# Patient Record
Sex: Female | Born: 1940
Health system: Southern US, Community
[De-identification: ages and names within clinical notes are randomized; demographics above are authoritative.]

## PROBLEM LIST (undated history)

## (undated) DIAGNOSIS — E7800 Pure hypercholesterolemia, unspecified: Secondary | ICD-10-CM

## (undated) DIAGNOSIS — F419 Anxiety disorder, unspecified: Secondary | ICD-10-CM

## (undated) DIAGNOSIS — R011 Cardiac murmur, unspecified: Secondary | ICD-10-CM

## (undated) DIAGNOSIS — I10 Essential (primary) hypertension: Secondary | ICD-10-CM

## (undated) DIAGNOSIS — C801 Malignant (primary) neoplasm, unspecified: Secondary | ICD-10-CM

## (undated) DIAGNOSIS — C50919 Malignant neoplasm of unspecified site of unspecified female breast: Secondary | ICD-10-CM

## (undated) HISTORY — DX: Malignant neoplasm of unspecified site of unspecified female breast: C50.919

## (undated) HISTORY — DX: Essential (primary) hypertension: I10

## (undated) HISTORY — DX: Pure hypercholesterolemia, unspecified: E78.00

## (undated) HISTORY — DX: Malignant (primary) neoplasm, unspecified: C80.1

## (undated) HISTORY — PX: CHOLECYSTECTOMY: SHX55

## (undated) HISTORY — DX: Anxiety disorder, unspecified: F41.9

---

## 1988-12-09 HISTORY — PX: CHOLECYSTECTOMY, LAPAROSCOPIC: SHX56

## 1998-09-09 ENCOUNTER — Emergency Department (HOSPITAL_COMMUNITY): Admission: EM | Admit: 1998-09-09 | Discharge: 1998-09-09 | Payer: Self-pay | Admitting: Emergency Medicine

## 1998-09-09 ENCOUNTER — Encounter: Payer: Self-pay | Admitting: Emergency Medicine

## 1999-08-27 ENCOUNTER — Other Ambulatory Visit: Admission: RE | Admit: 1999-08-27 | Discharge: 1999-08-27 | Payer: Self-pay | Admitting: Orthopedic Surgery

## 2001-12-09 HISTORY — PX: CATARACT EXTRACTION, BILATERAL: SHX1313

## 2009-12-28 ENCOUNTER — Encounter: Admission: RE | Admit: 2009-12-28 | Discharge: 2009-12-28 | Payer: Self-pay | Admitting: General Surgery

## 2010-01-01 ENCOUNTER — Ambulatory Visit (HOSPITAL_BASED_OUTPATIENT_CLINIC_OR_DEPARTMENT_OTHER): Admission: RE | Admit: 2010-01-01 | Discharge: 2010-01-01 | Payer: Self-pay | Admitting: General Surgery

## 2010-01-19 ENCOUNTER — Ambulatory Visit: Payer: Self-pay | Admitting: Oncology

## 2010-01-30 ENCOUNTER — Ambulatory Visit: Admission: RE | Admit: 2010-01-30 | Discharge: 2010-04-11 | Payer: Self-pay | Admitting: Radiation Oncology

## 2010-02-05 LAB — CBC WITH DIFFERENTIAL/PLATELET
BASO%: 0.3 % (ref 0.0–2.0)
Basophils Absolute: 0 10*3/uL (ref 0.0–0.1)
EOS%: 1.1 % (ref 0.0–7.0)
Eosinophils Absolute: 0.1 10*3/uL (ref 0.0–0.5)
HCT: 38.3 % (ref 34.8–46.6)
HGB: 13.7 g/dL (ref 11.6–15.9)
LYMPH%: 21.9 % (ref 14.0–49.7)
MCH: 33 pg (ref 25.1–34.0)
MCHC: 35.7 g/dL (ref 31.5–36.0)
MCV: 92.4 fL (ref 79.5–101.0)
MONO#: 0.7 10*3/uL (ref 0.1–0.9)
MONO%: 7.1 % (ref 0.0–14.0)
NEUT#: 7 10*3/uL — ABNORMAL HIGH (ref 1.5–6.5)
NEUT%: 69.6 % (ref 38.4–76.8)
Platelets: 288 10*3/uL (ref 145–400)
RBC: 4.15 10*6/uL (ref 3.70–5.45)
RDW: 12.3 % (ref 11.2–14.5)
WBC: 10.1 10*3/uL (ref 3.9–10.3)
lymph#: 2.2 10*3/uL (ref 0.9–3.3)

## 2010-02-05 LAB — COMPREHENSIVE METABOLIC PANEL
ALT: 16 U/L (ref 0–35)
AST: 21 U/L (ref 0–37)
Albumin: 4.1 g/dL (ref 3.5–5.2)
Alkaline Phosphatase: 63 U/L (ref 39–117)
BUN: 9 mg/dL (ref 6–23)
CO2: 26 mEq/L (ref 19–32)
Calcium: 9.4 mg/dL (ref 8.4–10.5)
Chloride: 102 mEq/L (ref 96–112)
Creatinine, Ser: 1.15 mg/dL (ref 0.40–1.20)
Glucose, Bld: 89 mg/dL (ref 70–99)
Potassium: 3.7 mEq/L (ref 3.5–5.3)
Sodium: 138 mEq/L (ref 135–145)
Total Bilirubin: 0.9 mg/dL (ref 0.3–1.2)
Total Protein: 8 g/dL (ref 6.0–8.3)

## 2010-02-28 ENCOUNTER — Ambulatory Visit: Payer: Self-pay | Admitting: Oncology

## 2010-03-02 LAB — CBC WITH DIFFERENTIAL/PLATELET
BASO%: 0.4 % (ref 0.0–2.0)
Basophils Absolute: 0 10*3/uL (ref 0.0–0.1)
EOS%: 1.7 % (ref 0.0–7.0)
Eosinophils Absolute: 0.2 10*3/uL (ref 0.0–0.5)
HCT: 39 % (ref 34.8–46.6)
HGB: 13.8 g/dL (ref 11.6–15.9)
LYMPH%: 17.9 % (ref 14.0–49.7)
MCH: 33 pg (ref 25.1–34.0)
MCHC: 35.3 g/dL (ref 31.5–36.0)
MCV: 93.7 fL (ref 79.5–101.0)
MONO#: 0.7 10*3/uL (ref 0.1–0.9)
MONO%: 6.6 % (ref 0.0–14.0)
NEUT#: 7.5 10*3/uL — ABNORMAL HIGH (ref 1.5–6.5)
NEUT%: 73.4 % (ref 38.4–76.8)
Platelets: 308 10*3/uL (ref 145–400)
RBC: 4.16 10*6/uL (ref 3.70–5.45)
RDW: 12.5 % (ref 11.2–14.5)
WBC: 10.3 10*3/uL (ref 3.9–10.3)
lymph#: 1.8 10*3/uL (ref 0.9–3.3)

## 2010-03-02 LAB — BASIC METABOLIC PANEL
BUN: 15 mg/dL (ref 6–23)
CO2: 25 mEq/L (ref 19–32)
Calcium: 9.7 mg/dL (ref 8.4–10.5)
Chloride: 101 mEq/L (ref 96–112)
Creatinine, Ser: 1 mg/dL (ref 0.40–1.20)
Glucose, Bld: 98 mg/dL (ref 70–99)
Potassium: 4 mEq/L (ref 3.5–5.3)
Sodium: 137 mEq/L (ref 135–145)

## 2010-04-27 ENCOUNTER — Ambulatory Visit: Payer: Self-pay | Admitting: Oncology

## 2010-04-30 LAB — CBC WITH DIFFERENTIAL/PLATELET
BASO%: 0 % (ref 0.0–2.0)
Basophils Absolute: 0 10*3/uL (ref 0.0–0.1)
EOS%: 2.5 % (ref 0.0–7.0)
Eosinophils Absolute: 0.2 10*3/uL (ref 0.0–0.5)
HCT: 39.1 % (ref 34.8–46.6)
HGB: 13.7 g/dL (ref 11.6–15.9)
LYMPH%: 10.6 % — ABNORMAL LOW (ref 14.0–49.7)
MCH: 32 pg (ref 25.1–34.0)
MCHC: 35 g/dL (ref 31.5–36.0)
MCV: 91.4 fL (ref 79.5–101.0)
MONO#: 0.6 10*3/uL (ref 0.1–0.9)
MONO%: 5.7 % (ref 0.0–14.0)
NEUT#: 7.9 10*3/uL — ABNORMAL HIGH (ref 1.5–6.5)
NEUT%: 81.2 % — ABNORMAL HIGH (ref 38.4–76.8)
Platelets: 308 10*3/uL (ref 145–400)
RBC: 4.28 10*6/uL (ref 3.70–5.45)
RDW: 12.8 % (ref 11.2–14.5)
WBC: 9.8 10*3/uL (ref 3.9–10.3)
lymph#: 1 10*3/uL (ref 0.9–3.3)

## 2010-04-30 LAB — BASIC METABOLIC PANEL
BUN: 16 mg/dL (ref 6–23)
CO2: 21 mEq/L (ref 19–32)
Calcium: 9.6 mg/dL (ref 8.4–10.5)
Chloride: 102 mEq/L (ref 96–112)
Creatinine, Ser: 0.9 mg/dL (ref 0.40–1.20)
Glucose, Bld: 106 mg/dL — ABNORMAL HIGH (ref 70–99)
Potassium: 4.1 mEq/L (ref 3.5–5.3)
Sodium: 136 mEq/L (ref 135–145)

## 2010-06-21 ENCOUNTER — Ambulatory Visit: Payer: Self-pay | Admitting: Oncology

## 2010-06-25 LAB — CBC WITH DIFFERENTIAL/PLATELET
BASO%: 0.9 % (ref 0.0–2.0)
Basophils Absolute: 0.1 10*3/uL (ref 0.0–0.1)
EOS%: 3.3 % (ref 0.0–7.0)
Eosinophils Absolute: 0.3 10*3/uL (ref 0.0–0.5)
HCT: 39.2 % (ref 34.8–46.6)
HGB: 13.8 g/dL (ref 11.6–15.9)
LYMPH%: 14 % (ref 14.0–49.7)
MCH: 32.7 pg (ref 25.1–34.0)
MCHC: 35.2 g/dL (ref 31.5–36.0)
MCV: 92.9 fL (ref 79.5–101.0)
MONO#: 0.5 10*3/uL (ref 0.1–0.9)
MONO%: 5.8 % (ref 0.0–14.0)
NEUT#: 6.2 10*3/uL (ref 1.5–6.5)
NEUT%: 76 % (ref 38.4–76.8)
Platelets: 302 10*3/uL (ref 145–400)
RBC: 4.23 10*6/uL (ref 3.70–5.45)
RDW: 13.3 % (ref 11.2–14.5)
WBC: 8.2 10*3/uL (ref 3.9–10.3)
lymph#: 1.1 10*3/uL (ref 0.9–3.3)

## 2010-06-26 LAB — COMPREHENSIVE METABOLIC PANEL
ALT: 11 U/L (ref 0–35)
AST: 14 U/L (ref 0–37)
Albumin: 4.6 g/dL (ref 3.5–5.2)
Alkaline Phosphatase: 60 U/L (ref 39–117)
BUN: 13 mg/dL (ref 6–23)
CO2: 24 mEq/L (ref 19–32)
Calcium: 9.6 mg/dL (ref 8.4–10.5)
Chloride: 106 mEq/L (ref 96–112)
Creatinine, Ser: 1 mg/dL (ref 0.40–1.20)
Glucose, Bld: 92 mg/dL (ref 70–99)
Potassium: 4.3 mEq/L (ref 3.5–5.3)
Sodium: 141 mEq/L (ref 135–145)
Total Bilirubin: 0.5 mg/dL (ref 0.3–1.2)
Total Protein: 7.8 g/dL (ref 6.0–8.3)

## 2010-12-21 ENCOUNTER — Emergency Department (HOSPITAL_COMMUNITY)
Admission: EM | Admit: 2010-12-21 | Discharge: 2010-12-21 | Payer: Self-pay | Source: Home / Self Care | Admitting: Emergency Medicine

## 2010-12-24 LAB — CBC
HCT: 40.1 % (ref 36.0–46.0)
Hemoglobin: 14.2 g/dL (ref 12.0–15.0)
MCH: 32.6 pg (ref 26.0–34.0)
MCHC: 35.4 g/dL (ref 30.0–36.0)
MCV: 92 fL (ref 78.0–100.0)
Platelets: 260 10*3/uL (ref 150–400)
RBC: 4.36 MIL/uL (ref 3.87–5.11)
RDW: 12.4 % (ref 11.5–15.5)
WBC: 10.1 10*3/uL (ref 4.0–10.5)

## 2010-12-24 LAB — URINALYSIS, ROUTINE W REFLEX MICROSCOPIC
Bilirubin Urine: NEGATIVE
Hgb urine dipstick: NEGATIVE
Ketones, ur: NEGATIVE mg/dL
Nitrite: NEGATIVE
Protein, ur: NEGATIVE mg/dL
Specific Gravity, Urine: 1.006 (ref 1.005–1.030)
Urine Glucose, Fasting: NEGATIVE mg/dL
Urobilinogen, UA: 0.2 mg/dL (ref 0.0–1.0)
pH: 6.5 (ref 5.0–8.0)

## 2010-12-24 LAB — DIFFERENTIAL
Basophils Absolute: 0 10*3/uL (ref 0.0–0.1)
Basophils Relative: 0 % (ref 0–1)
Eosinophils Absolute: 0.2 10*3/uL (ref 0.0–0.7)
Eosinophils Relative: 2 % (ref 0–5)
Lymphocytes Relative: 17 % (ref 12–46)
Lymphs Abs: 1.7 10*3/uL (ref 0.7–4.0)
Monocytes Absolute: 0.6 10*3/uL (ref 0.1–1.0)
Monocytes Relative: 6 % (ref 3–12)
Neutro Abs: 7.5 10*3/uL (ref 1.7–7.7)
Neutrophils Relative %: 74 % (ref 43–77)

## 2010-12-24 LAB — BASIC METABOLIC PANEL
BUN: 13 mg/dL (ref 6–23)
CO2: 23 mEq/L (ref 19–32)
Calcium: 9.5 mg/dL (ref 8.4–10.5)
Chloride: 105 mEq/L (ref 96–112)
Creatinine, Ser: 0.87 mg/dL (ref 0.4–1.2)
GFR calc Af Amer: >60 mL/min (ref >60)
GFR calc non Af Amer: >60 mL/min (ref >60)
Glucose, Bld: 110 mg/dL — ABNORMAL HIGH (ref 70–99)
Potassium: 3.6 mEq/L (ref 3.5–5.1)
Sodium: 137 mEq/L (ref 135–145)

## 2010-12-24 LAB — POCT CARDIAC MARKERS
CKMB, poc: <1 ng/mL — ABNORMAL LOW (ref 1.0–8.0)
CKMB, poc: <1 ng/mL — ABNORMAL LOW (ref 1.0–8.0)
Myoglobin, poc: 55.4 ng/mL (ref 12–200)
Myoglobin, poc: 44.9 ng/mL (ref 12–200)
Troponin i, poc: <0.05 ng/mL (ref 0.00–0.09)
Troponin i, poc: <0.05 ng/mL (ref 0.00–0.09)

## 2010-12-24 LAB — D-DIMER, QUANTITATIVE: D-Dimer, Quant: 0.29 ug/mL-FEU (ref 0.00–0.48)

## 2010-12-24 LAB — BRAIN NATRIURETIC PEPTIDE: Pro B Natriuretic peptide (BNP): <30 pg/mL (ref 0.0–100.0)

## 2011-02-11 ENCOUNTER — Encounter (HOSPITAL_BASED_OUTPATIENT_CLINIC_OR_DEPARTMENT_OTHER): Payer: Medicare Other | Admitting: Oncology

## 2011-02-11 ENCOUNTER — Other Ambulatory Visit: Payer: Self-pay | Admitting: Physician Assistant

## 2011-02-11 DIAGNOSIS — D0590 Unspecified type of carcinoma in situ of unspecified breast: Secondary | ICD-10-CM

## 2011-02-11 DIAGNOSIS — C50919 Malignant neoplasm of unspecified site of unspecified female breast: Secondary | ICD-10-CM

## 2011-02-11 DIAGNOSIS — C50319 Malignant neoplasm of lower-inner quadrant of unspecified female breast: Secondary | ICD-10-CM

## 2011-02-11 DIAGNOSIS — Z170 Estrogen receptor positive status [ER+]: Secondary | ICD-10-CM

## 2011-02-11 LAB — CBC WITH DIFFERENTIAL/PLATELET
BASO%: 0.3 % (ref 0.0–2.0)
Basophils Absolute: 0 10*3/uL (ref 0.0–0.1)
EOS%: 2.8 % (ref 0.0–7.0)
Eosinophils Absolute: 0.2 10*3/uL (ref 0.0–0.5)
HCT: 34.9 % (ref 34.8–46.6)
HGB: 12.1 g/dL (ref 11.6–15.9)
LYMPH%: 18.3 % (ref 14.0–49.7)
MCH: 32.5 pg (ref 25.1–34.0)
MCHC: 34.5 g/dL (ref 31.5–36.0)
MCV: 94.1 fL (ref 79.5–101.0)
MONO#: 0.3 10*3/uL (ref 0.1–0.9)
MONO%: 5.9 % (ref 0.0–14.0)
NEUT#: 4 10*3/uL (ref 1.5–6.5)
NEUT%: 72.7 % (ref 38.4–76.8)
Platelets: 254 10*3/uL (ref 145–400)
RBC: 3.71 10*6/uL (ref 3.70–5.45)
RDW: 12.5 % (ref 11.2–14.5)
WBC: 5.6 10*3/uL (ref 3.9–10.3)
lymph#: 1 10*3/uL (ref 0.9–3.3)

## 2011-02-11 LAB — COMPREHENSIVE METABOLIC PANEL
ALT: 10 U/L (ref 0–35)
AST: 13 U/L (ref 0–37)
Albumin: 4 g/dL (ref 3.5–5.2)
Alkaline Phosphatase: 36 U/L — ABNORMAL LOW (ref 39–117)
BUN: 11 mg/dL (ref 6–23)
CO2: 25 mEq/L (ref 19–32)
Calcium: 8.8 mg/dL (ref 8.4–10.5)
Chloride: 105 mEq/L (ref 96–112)
Creatinine, Ser: 0.89 mg/dL (ref 0.40–1.20)
Glucose, Bld: 96 mg/dL (ref 70–99)
Potassium: 3.9 mEq/L (ref 3.5–5.3)
Sodium: 138 mEq/L (ref 135–145)
Total Bilirubin: 0.4 mg/dL (ref 0.3–1.2)
Total Protein: 6.6 g/dL (ref 6.0–8.3)

## 2011-02-11 LAB — CANCER ANTIGEN 27.29: CA 27.29: 13 U/mL (ref 0–39)

## 2011-02-25 LAB — CBC
HCT: 37.5 % (ref 36.0–46.0)
Hemoglobin: 13.2 g/dL (ref 12.0–15.0)
MCHC: 35.2 g/dL (ref 30.0–36.0)
MCV: 93.1 fL (ref 78.0–100.0)
Platelets: 282 10*3/uL (ref 150–400)
RBC: 4.03 MIL/uL (ref 3.87–5.11)
RDW: 12.6 % (ref 11.5–15.5)
WBC: 10.9 10*3/uL — ABNORMAL HIGH (ref 4.0–10.5)

## 2011-02-25 LAB — COMPREHENSIVE METABOLIC PANEL
ALT: 14 U/L (ref 0–35)
AST: 16 U/L (ref 0–37)
Albumin: 3.9 g/dL (ref 3.5–5.2)
Alkaline Phosphatase: 74 U/L (ref 39–117)
BUN: 11 mg/dL (ref 6–23)
CO2: 28 mEq/L (ref 19–32)
Calcium: 9.2 mg/dL (ref 8.4–10.5)
Chloride: 99 mEq/L (ref 96–112)
Creatinine, Ser: 0.79 mg/dL (ref 0.4–1.2)
GFR calc Af Amer: >60 mL/min (ref >60)
GFR calc non Af Amer: >60 mL/min (ref >60)
Glucose, Bld: 87 mg/dL (ref 70–99)
Potassium: 4.4 mEq/L (ref 3.5–5.1)
Sodium: 134 mEq/L — ABNORMAL LOW (ref 135–145)
Total Bilirubin: 0.8 mg/dL (ref 0.3–1.2)
Total Protein: 7.5 g/dL (ref 6.0–8.3)

## 2011-02-25 LAB — DIFFERENTIAL
Basophils Absolute: 0 10*3/uL (ref 0.0–0.1)
Basophils Relative: 0 % (ref 0–1)
Eosinophils Absolute: 0.2 10*3/uL (ref 0.0–0.7)
Eosinophils Relative: 2 % (ref 0–5)
Lymphocytes Relative: 15 % (ref 12–46)
Lymphs Abs: 1.7 10*3/uL (ref 0.7–4.0)
Monocytes Absolute: 0.6 10*3/uL (ref 0.1–1.0)
Monocytes Relative: 5 % (ref 3–12)
Neutro Abs: 8.4 10*3/uL — ABNORMAL HIGH (ref 1.7–7.7)
Neutrophils Relative %: 78 % — ABNORMAL HIGH (ref 43–77)

## 2011-02-25 LAB — URINE MICROSCOPIC-ADD ON

## 2011-02-25 LAB — URINALYSIS, ROUTINE W REFLEX MICROSCOPIC
Bilirubin Urine: NEGATIVE
Glucose, UA: NEGATIVE mg/dL
Hgb urine dipstick: NEGATIVE
Ketones, ur: NEGATIVE mg/dL
Nitrite: NEGATIVE
Protein, ur: NEGATIVE mg/dL
Specific Gravity, Urine: 1.014 (ref 1.005–1.030)
Urobilinogen, UA: 0.2 mg/dL (ref 0.0–1.0)
pH: 7 (ref 5.0–8.0)

## 2011-07-23 ENCOUNTER — Other Ambulatory Visit: Payer: Self-pay | Admitting: Family Medicine

## 2011-07-23 ENCOUNTER — Other Ambulatory Visit (HOSPITAL_COMMUNITY)
Admission: RE | Admit: 2011-07-23 | Discharge: 2011-07-23 | Disposition: A | Discharge status: Home / Self Care | Payer: Medicare Other | Source: Ambulatory Visit | Attending: Family Medicine | Admitting: Family Medicine

## 2011-07-23 DIAGNOSIS — Z124 Encounter for screening for malignant neoplasm of cervix: Secondary | ICD-10-CM | POA: Insufficient documentation

## 2011-08-19 ENCOUNTER — Encounter (HOSPITAL_BASED_OUTPATIENT_CLINIC_OR_DEPARTMENT_OTHER): Payer: Medicare Other | Admitting: Oncology

## 2011-08-19 ENCOUNTER — Other Ambulatory Visit: Payer: Self-pay | Admitting: Physician Assistant

## 2011-08-19 DIAGNOSIS — D0590 Unspecified type of carcinoma in situ of unspecified breast: Secondary | ICD-10-CM

## 2011-08-19 DIAGNOSIS — C50919 Malignant neoplasm of unspecified site of unspecified female breast: Secondary | ICD-10-CM

## 2011-08-19 DIAGNOSIS — E539 Vitamin B deficiency, unspecified: Secondary | ICD-10-CM

## 2011-08-19 DIAGNOSIS — C50319 Malignant neoplasm of lower-inner quadrant of unspecified female breast: Secondary | ICD-10-CM

## 2011-08-19 DIAGNOSIS — Z170 Estrogen receptor positive status [ER+]: Secondary | ICD-10-CM

## 2011-08-19 LAB — CBC WITH DIFFERENTIAL/PLATELET
BASO%: 0.4 % (ref 0.0–2.0)
Basophils Absolute: 0 10*3/uL (ref 0.0–0.1)
EOS%: 3.9 % (ref 0.0–7.0)
Eosinophils Absolute: 0.2 10*3/uL (ref 0.0–0.5)
HCT: 36 % (ref 34.8–46.6)
HGB: 12.6 g/dL (ref 11.6–15.9)
LYMPH%: 21.3 % (ref 14.0–49.7)
MCH: 32.8 pg (ref 25.1–34.0)
MCHC: 35 g/dL (ref 31.5–36.0)
MCV: 93.9 fL (ref 79.5–101.0)
MONO#: 0.3 10*3/uL (ref 0.1–0.9)
MONO%: 6.9 % (ref 0.0–14.0)
NEUT#: 3.1 10*3/uL (ref 1.5–6.5)
NEUT%: 67.5 % (ref 38.4–76.8)
Platelets: 232 10*3/uL (ref 145–400)
RBC: 3.84 10*6/uL (ref 3.70–5.45)
RDW: 12.7 % (ref 11.2–14.5)
WBC: 4.7 10*3/uL (ref 3.9–10.3)
lymph#: 1 10*3/uL (ref 0.9–3.3)

## 2011-08-19 LAB — COMPREHENSIVE METABOLIC PANEL
ALT: 10 U/L (ref 0–35)
AST: 12 U/L (ref 0–37)
Albumin: 4.1 g/dL (ref 3.5–5.2)
Alkaline Phosphatase: 37 U/L — ABNORMAL LOW (ref 39–117)
BUN: 18 mg/dL (ref 6–23)
CO2: 23 mEq/L (ref 19–32)
Calcium: 9.3 mg/dL (ref 8.4–10.5)
Chloride: 106 mEq/L (ref 96–112)
Creatinine, Ser: 0.81 mg/dL (ref 0.50–1.10)
Glucose, Bld: 91 mg/dL (ref 70–99)
Potassium: 3.9 mEq/L (ref 3.5–5.3)
Sodium: 138 mEq/L (ref 135–145)
Total Bilirubin: 0.4 mg/dL (ref 0.3–1.2)
Total Protein: 6.7 g/dL (ref 6.0–8.3)

## 2011-08-19 LAB — LACTATE DEHYDROGENASE: LDH: 120 U/L (ref 94–250)

## 2011-08-19 LAB — CANCER ANTIGEN 27.29: CA 27.29: 12 U/mL (ref 0–39)

## 2012-01-07 DIAGNOSIS — R609 Edema, unspecified: Secondary | ICD-10-CM | POA: Diagnosis not present

## 2012-01-07 DIAGNOSIS — F411 Generalized anxiety disorder: Secondary | ICD-10-CM | POA: Diagnosis not present

## 2012-01-07 DIAGNOSIS — E785 Hyperlipidemia, unspecified: Secondary | ICD-10-CM | POA: Diagnosis not present

## 2012-01-07 DIAGNOSIS — Z0000 Encounter for general adult medical examination without abnormal findings: Secondary | ICD-10-CM | POA: Diagnosis not present

## 2012-01-07 DIAGNOSIS — Z1211 Encounter for screening for malignant neoplasm of colon: Secondary | ICD-10-CM | POA: Diagnosis not present

## 2012-01-07 DIAGNOSIS — Z23 Encounter for immunization: Secondary | ICD-10-CM | POA: Diagnosis not present

## 2012-01-07 DIAGNOSIS — M545 Low back pain, unspecified: Secondary | ICD-10-CM | POA: Diagnosis not present

## 2012-01-07 DIAGNOSIS — I10 Essential (primary) hypertension: Secondary | ICD-10-CM | POA: Diagnosis not present

## 2012-01-11 ENCOUNTER — Telehealth: Payer: Self-pay | Admitting: Oncology

## 2012-01-11 NOTE — Telephone Encounter (Signed)
called pt and scheduled appts for march2013 °

## 2012-02-20 ENCOUNTER — Other Ambulatory Visit (HOSPITAL_BASED_OUTPATIENT_CLINIC_OR_DEPARTMENT_OTHER): Payer: Medicare Other | Admitting: Lab

## 2012-02-20 ENCOUNTER — Ambulatory Visit (HOSPITAL_BASED_OUTPATIENT_CLINIC_OR_DEPARTMENT_OTHER): Payer: Medicare Other | Admitting: Family

## 2012-02-20 ENCOUNTER — Telehealth: Payer: Self-pay | Admitting: Oncology

## 2012-02-20 ENCOUNTER — Encounter: Payer: Self-pay | Admitting: Family

## 2012-02-20 VITALS — BP 131/71 | HR 63 | Temp 98.5°F | Ht 64.5 in | Wt 199.7 lb

## 2012-02-20 DIAGNOSIS — D0510 Intraductal carcinoma in situ of unspecified breast: Secondary | ICD-10-CM

## 2012-02-20 DIAGNOSIS — D0590 Unspecified type of carcinoma in situ of unspecified breast: Secondary | ICD-10-CM | POA: Diagnosis not present

## 2012-02-20 DIAGNOSIS — Z170 Estrogen receptor positive status [ER+]: Secondary | ICD-10-CM

## 2012-02-20 DIAGNOSIS — E539 Vitamin B deficiency, unspecified: Secondary | ICD-10-CM

## 2012-02-20 DIAGNOSIS — C50919 Malignant neoplasm of unspecified site of unspecified female breast: Secondary | ICD-10-CM

## 2012-02-20 DIAGNOSIS — C50319 Malignant neoplasm of lower-inner quadrant of unspecified female breast: Secondary | ICD-10-CM

## 2012-02-20 LAB — CBC WITH DIFFERENTIAL/PLATELET
BASO%: 0.4 % (ref 0.0–2.0)
EOS%: 3.4 % (ref 0.0–7.0)
HCT: 38.8 % (ref 34.8–46.6)
LYMPH%: 15.6 % (ref 14.0–49.7)
MCH: 31.5 pg (ref 25.1–34.0)
MCHC: 34.8 g/dL (ref 31.5–36.0)
MCV: 90.7 fL (ref 79.5–101.0)
MONO%: 4.7 % (ref 0.0–14.0)
NEUT%: 75.9 % (ref 38.4–76.8)
lymph#: 1.3 10*3/uL (ref 0.9–3.3)

## 2012-02-20 NOTE — Progress Notes (Signed)
[facility]  Name: Erin Gillespie                  DATE: 02/20/2012 MRN: [id number]                      DOB: 07-01-1941  DIAGNOSIS: Patient Active Problem List  Diagnoses Date Noted  . 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  . 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 Left lumpectomy Dec. 2010; 1.5 grade 2 DCIS with necrosis. ER/PR+. 2. Radiation therapy form 03/05/2010 - 04/30/2010.   CURRENT THERAPY: Tamoxifen  INTERIM HISTORY: Has felt well, no side effects attributable to Tamoxifen. No hot flashes, no mood swings, no weight gain, no joint aches, no abnormal vaginal bleeding. No self-detected abnormalities in the breast. Last mammo 03/10/11 at [place].   PHYSICAL EXAM: BP 131/71  Pulse 63  Temp(Src) 98.5 F (36.9 C) (Oral)  Ht 5' 4.5" (1.638 m)  Wt 199 lb 11.2 oz (90.583 kg)  BMI 33.75 kg/m2 General: Well developed, well nourished, in no acute distress.  EENT: No ocular or oral lesions. No stomatitis.  Respiratory: Lungs are clear to auscultation bilaterally with normal respiratory movement and no accessory muscle use. Cardiac: No murmur, rub or tachycardia. No upper or lower extremity edema.  GI: Abdomen is soft, no palpable hepatosplenomegaly. No fluid wave. No tenderness. Musculoskeletal: No kyphosis, no tenderness over the spine, ribs or hips. Lymph: No cervical, infraclavicular, axillary or inguinal adenopathy. Neuro: No focal neurological deficits. Psych: Alert and oriented X 3, appropriate mood and affect.  BREAST EXAM: In the supine position, with the right arm over the head, the right nipple is everted. No periareolar edema or nipple discharge. No mass in any quadrant or subareolar region. No redness of the skin. No right axillary adenopathy. With the left arm over the head, the left nipple is everted. No periareolar edema or nipple discharge. No mass in any quadrant or subareolar region. In  the 8 o'clock position, a well healed remote lumpectomy incision, with volume deficit underlying the incision. No redness of the skin. No left axillary adenopathy.     LABORATORY STUDIES:   Results for orders placed in visit on 02/20/12  CBC WITH DIFFERENTIAL      Component Value Range   WBC 8.2  3.9 - 10.3 (10e3/uL)   NEUT# 6.2  1.5 - 6.5 (10e3/uL)   HGB 13.5  11.6 - 15.9 (g/dL)   HCT 40.9  81.1 - 91.4 (%)   Platelets 301  145 - 400 (10e3/uL)   MCV 90.7  79.5 - 101.0 (fL)   MCH 31.5  25.1 - 34.0 (pg)   MCHC 34.8  31.5 - 36.0 (g/dL)   RBC 7.82  9.56 - 2.13 (10e6/uL)   RDW 12.5  11.2 - 14.5 (%)   lymph# 1.3  0.9 - 3.3 (10e3/uL)   MONO# 0.4  0.1 - 0.9 (10e3/uL)   Eosinophils Absolute 0.3  0.0 - 0.5 (10e3/uL)   Basophils Absolute 0.0  0.0 - 0.1 (10e3/uL)   NEUT% 75.9  38.4 - 76.8 (%)   LYMPH% 15.6  14.0 - 49.7 (%)   MONO% 4.7  0.0 - 14.0 (%)   EOS% 3.4  0.0 - 7.0 (%)   BASO% 0.4  0.0 - 2.0 (%)  COMPREHENSIVE METABOLIC PANEL      Component Value Range   Sodium 138  135 - 145 (mEq/L)   Potassium 4.2  3.5 - 5.3 (mEq/L)   Chloride 102  96 - 112 (mEq/L)   CO2 24  19 - 32 (mEq/L)   Glucose, Bld 106 (*) 70 - 99 (mg/dL)   BUN 22  6 - 23 (mg/dL)   Creatinine, Ser 4.13  0.50 - 1.10 (mg/dL)   Total Bilirubin 0.4  0.3 - 1.2 (mg/dL)   Alkaline Phosphatase 50  39 - 117 (U/L)   AST 12  0 - 37 (U/L)   ALT 11  0 - 35 (U/L)   Total Protein 7.4  6.0 - 8.3 (g/dL)   Albumin 4.5  3.5 - 5.2 (g/dL)   Calcium 9.6  8.4 - 24.4 (mg/dL)  LACTATE DEHYDROGENASE      Component Value Range   LD 116  94 - 250 (U/L)    IMPRESSION:   1. Ductal carcinoma in situ December 2010, no evidence of recurrence.  2. On Tamoxifen with good tolerance.   PLAN:   1. Return in 6 months to see Dr. Welton Flakes.

## 2012-02-20 NOTE — Telephone Encounter (Signed)
gve the pt her sept 2013 appt calendar. Pt has her mammo appt for april

## 2012-02-21 LAB — COMPREHENSIVE METABOLIC PANEL
ALT: 11 U/L (ref 0–35)
Alkaline Phosphatase: 50 U/L (ref 39–117)
Creatinine, Ser: 0.91 mg/dL (ref 0.50–1.10)
Glucose, Bld: 106 mg/dL — ABNORMAL HIGH (ref 70–99)
Sodium: 138 mEq/L (ref 135–145)
Total Bilirubin: 0.4 mg/dL (ref 0.3–1.2)
Total Protein: 7.4 g/dL (ref 6.0–8.3)

## 2012-02-21 LAB — CANCER ANTIGEN 27.29: CA 27.29: 15 U/mL (ref 0–39)

## 2012-03-23 DIAGNOSIS — Z853 Personal history of malignant neoplasm of breast: Secondary | ICD-10-CM | POA: Diagnosis not present

## 2012-07-01 ENCOUNTER — Telehealth: Payer: Self-pay | Admitting: Oncology

## 2012-07-01 NOTE — Telephone Encounter (Signed)
S/w the pt and she is aware of the appt time change on 08/20/2012 from 1:00pm to 11:30am due to the md's schedule

## 2012-08-20 ENCOUNTER — Ambulatory Visit (HOSPITAL_BASED_OUTPATIENT_CLINIC_OR_DEPARTMENT_OTHER): Payer: Medicare Other | Admitting: Oncology

## 2012-08-20 ENCOUNTER — Telehealth: Payer: Self-pay | Admitting: *Deleted

## 2012-08-20 ENCOUNTER — Encounter: Payer: Self-pay | Admitting: Oncology

## 2012-08-20 VITALS — BP 126/79 | HR 83 | Temp 98.8°F | Resp 20 | Ht 64.5 in | Wt 196.2 lb

## 2012-08-20 DIAGNOSIS — D0590 Unspecified type of carcinoma in situ of unspecified breast: Secondary | ICD-10-CM

## 2012-08-20 DIAGNOSIS — D0510 Intraductal carcinoma in situ of unspecified breast: Secondary | ICD-10-CM

## 2012-08-20 MED ORDER — TAMOXIFEN CITRATE 20 MG PO TABS: 20.0000 mg | ORAL_TABLET | Freq: Every day | ORAL | Status: DC

## 2012-08-20 NOTE — Progress Notes (Signed)
[facility]  Name: Erin Gillespie                  DATE: 08/20/2012 MRN: [id number]                      DOB: 05/01/1941  DIAGNOSIS: Patient Active Problem List   Diagnosis Date Noted  . 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  . 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 Left lumpectomy Dec. 2010; 1.5 grade 2 DCIS with necrosis. ER/PR+. 2. Radiation therapy form 03/05/2010 - 04/30/2010.   CURRENT THERAPY: Tamoxifen  INTERIM HISTORY: Has felt well, no side effects attributable to Tamoxifen. No hot flashes, no mood swings, no weight gain, no joint aches, no abnormal vaginal bleeding. No self-detected abnormalities in the breast. Last mammo 03/10/11 at [place].   PHYSICAL EXAM: BP 126/79  Pulse 83  Temp 98.8 F (37.1 C) (Oral)  Resp 20  Ht 5' 4.5" (1.638 m)  Wt 196 lb 3.2 oz (88.996 kg)  BMI 33.16 kg/m2 General: Well developed, well nourished, in no acute distress.  EENT: No ocular or oral lesions. No stomatitis.  Respiratory: Lungs are clear to auscultation bilaterally with normal respiratory movement and no accessory muscle use. Cardiac: No murmur, rub or tachycardia. No upper or lower extremity edema.  GI: Abdomen is soft, no palpable hepatosplenomegaly. No fluid wave. No tenderness. Musculoskeletal: No kyphosis, no tenderness over the spine, ribs or hips. Lymph: No cervical, infraclavicular, axillary or inguinal adenopathy. Neuro: No focal neurological deficits. Psych: Alert and oriented X 3, appropriate mood and affect.  BREAST EXAM: In the supine position, with the right arm over the head, the right nipple is everted. No periareolar edema or nipple discharge. No mass in any quadrant or subareolar region. No redness of the skin. No right axillary adenopathy. With the left arm over the head, the left nipple is everted. No periareolar edema or nipple discharge. No mass in any quadrant or subareolar region.  In the 8 o'clock position, a well healed remote lumpectomy incision, with volume deficit underlying the incision. No redness of the skin. No left axillary adenopathy.     LABORATORY STUDIES:   Results for orders placed in visit on 02/20/12  CBC WITH DIFFERENTIAL      Component Value Range   WBC 8.2  3.9 - 10.3 10e3/uL   NEUT# 6.2  1.5 - 6.5 10e3/uL   HGB 13.5  11.6 - 15.9 g/dL   HCT 78.2  95.6 - 21.3 %   Platelets 301  145 - 400 10e3/uL   MCV 90.7  79.5 - 101.0 fL   MCH 31.5  25.1 - 34.0 pg   MCHC 34.8  31.5 - 36.0 g/dL   RBC 0.86  5.78 - 4.69 10e6/uL   RDW 12.5  11.2 - 14.5 %   lymph# 1.3  0.9 - 3.3 10e3/uL   MONO# 0.4  0.1 - 0.9 10e3/uL   Eosinophils Absolute 0.3  0.0 - 0.5 10e3/uL   Basophils Absolute 0.0  0.0 - 0.1 10e3/uL   NEUT% 75.9  38.4 - 76.8 %   LYMPH% 15.6  14.0 - 49.7 %   MONO% 4.7  0.0 - 14.0 %   EOS% 3.4  0.0 - 7.0 %   BASO% 0.4  0.0 - 2.0 %  COMPREHENSIVE METABOLIC PANEL      Component Value Range  Sodium 138  135 - 145 mEq/L   Potassium 4.2  3.5 - 5.3 mEq/L   Chloride 102  96 - 112 mEq/L   CO2 24  19 - 32 mEq/L   Glucose, Bld 106 (*) 70 - 99 mg/dL   BUN 22  6 - 23 mg/dL   Creatinine, Ser 1.61  0.50 - 1.10 mg/dL   Total Bilirubin 0.4  0.3 - 1.2 mg/dL   Alkaline Phosphatase 50  39 - 117 U/L   AST 12  0 - 37 U/L   ALT 11  0 - 35 U/L   Total Protein 7.4  6.0 - 8.3 g/dL   Albumin 4.5  3.5 - 5.2 g/dL   Calcium 9.6  8.4 - 09.6 mg/dL  LACTATE DEHYDROGENASE      Component Value Range   LDH 116  94 - 250 U/L  CANCER ANTIGEN 27.29      Component Value Range   CA 27.29 15  0 - 39 U/mL  VITAMIN D 25 HYDROXY      Component Value Range   Vit D, 25-Hydroxy 25 (*) 30 - 89 ng/mL    IMPRESSION:   70 year old female with DCIS of the left breast status post lumpectomy December 2010 for a 1.5 cm grade 2 DCIS with necrosis ER positive PR positive. Patient afterwards underwent radiation therapy from March 2011 to may 2011. She was then begun on tamoxifen 20 mg daily  overall she is tolerating it well.  PLAN:   We will continue tamoxifen for a total of 5 years. Risks and benefits of tamoxifen were discussed with the patient again.I will plan on seeing her back in 6 months time in followup.   Drue Second, MD Medical/Oncology [facility] [phone] (beeper) [phone] (Office)

## 2012-08-20 NOTE — Patient Instructions (Addendum)
Continue tamoxifen daily.  I will continue to see you every 6 months

## 2012-08-20 NOTE — Telephone Encounter (Signed)
Gave patient appointment for 02-25-2013 at 10:00am

## 2012-09-08 DIAGNOSIS — H25019 Cortical age-related cataract, unspecified eye: Secondary | ICD-10-CM | POA: Diagnosis not present

## 2012-09-15 DIAGNOSIS — R609 Edema, unspecified: Secondary | ICD-10-CM | POA: Diagnosis not present

## 2012-09-15 DIAGNOSIS — I10 Essential (primary) hypertension: Secondary | ICD-10-CM | POA: Diagnosis not present

## 2012-09-15 DIAGNOSIS — Z23 Encounter for immunization: Secondary | ICD-10-CM | POA: Diagnosis not present

## 2012-09-15 DIAGNOSIS — G4700 Insomnia, unspecified: Secondary | ICD-10-CM | POA: Diagnosis not present

## 2012-09-15 DIAGNOSIS — M545 Low back pain, unspecified: Secondary | ICD-10-CM | POA: Diagnosis not present

## 2012-09-15 DIAGNOSIS — E785 Hyperlipidemia, unspecified: Secondary | ICD-10-CM | POA: Diagnosis not present

## 2012-09-15 DIAGNOSIS — F411 Generalized anxiety disorder: Secondary | ICD-10-CM | POA: Diagnosis not present

## 2012-10-13 DIAGNOSIS — H2510 Age-related nuclear cataract, unspecified eye: Secondary | ICD-10-CM | POA: Diagnosis not present

## 2012-12-21 DIAGNOSIS — H269 Unspecified cataract: Secondary | ICD-10-CM | POA: Diagnosis not present

## 2012-12-21 DIAGNOSIS — H2510 Age-related nuclear cataract, unspecified eye: Secondary | ICD-10-CM | POA: Diagnosis not present

## 2012-12-22 DIAGNOSIS — H2510 Age-related nuclear cataract, unspecified eye: Secondary | ICD-10-CM | POA: Diagnosis not present

## 2013-01-04 DIAGNOSIS — H2510 Age-related nuclear cataract, unspecified eye: Secondary | ICD-10-CM | POA: Diagnosis not present

## 2013-01-04 DIAGNOSIS — H269 Unspecified cataract: Secondary | ICD-10-CM | POA: Diagnosis not present

## 2013-02-25 ENCOUNTER — Ambulatory Visit (HOSPITAL_BASED_OUTPATIENT_CLINIC_OR_DEPARTMENT_OTHER): Payer: Medicare Other | Admitting: Oncology

## 2013-02-25 ENCOUNTER — Telehealth: Payer: Self-pay | Admitting: Oncology

## 2013-02-25 ENCOUNTER — Other Ambulatory Visit (HOSPITAL_BASED_OUTPATIENT_CLINIC_OR_DEPARTMENT_OTHER): Payer: Medicare Other | Admitting: Lab

## 2013-02-25 ENCOUNTER — Encounter: Payer: Self-pay | Admitting: Oncology

## 2013-02-25 VITALS — BP 126/73 | HR 68 | Temp 98.2°F | Resp 20 | Ht 64.0 in | Wt 198.3 lb

## 2013-02-25 DIAGNOSIS — Z170 Estrogen receptor positive status [ER+]: Secondary | ICD-10-CM | POA: Diagnosis not present

## 2013-02-25 DIAGNOSIS — D0590 Unspecified type of carcinoma in situ of unspecified breast: Secondary | ICD-10-CM

## 2013-02-25 DIAGNOSIS — D0512 Intraductal carcinoma in situ of left breast: Secondary | ICD-10-CM

## 2013-02-25 LAB — CBC WITH DIFFERENTIAL/PLATELET
Basophils Absolute: 0 10*3/uL (ref 0.0–0.1)
Eosinophils Absolute: 0.3 10*3/uL (ref 0.0–0.5)
HGB: 13.2 g/dL (ref 11.6–15.9)
MCV: 91.3 fL (ref 79.5–101.0)
MONO#: 0.5 10*3/uL (ref 0.1–0.9)
MONO%: 5.7 % (ref 0.0–14.0)
NEUT#: 6.4 10*3/uL (ref 1.5–6.5)
RDW: 12.8 % (ref 11.2–14.5)
WBC: 8.7 10*3/uL (ref 3.9–10.3)

## 2013-02-25 LAB — COMPREHENSIVE METABOLIC PANEL (CC13)
ALT: 14 U/L (ref 0–55)
CO2: 25 mEq/L (ref 22–29)
Calcium: 9 mg/dL (ref 8.4–10.4)
Chloride: 104 mEq/L (ref 98–107)
Creatinine: 1 mg/dL (ref 0.6–1.1)
Glucose: 107 mg/dl — ABNORMAL HIGH (ref 70–99)
Sodium: 139 mEq/L (ref 136–145)
Total Protein: 7.3 g/dL (ref 6.4–8.3)

## 2013-02-25 MED ORDER — TAMOXIFEN CITRATE 20 MG PO TABS: 20.0000 mg | ORAL_TABLET | Freq: Every day | ORAL | Status: DC

## 2013-02-25 NOTE — Patient Instructions (Addendum)
Continue tamoxifen 20 mg daily  I will see you back in 1 year 

## 2013-02-25 NOTE — Progress Notes (Signed)
[facility]  Name: Erin Gillespie                  DATE: 02/25/2013 MRN: [id number]                      DOB: [birth year]  DIAGNOSIS: Patient Active Problem List   Diagnosis Date Noted  . 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  . Ductal carcinoma in situ of breast, left Yes    PREVIOUS THERAPY:  1. Left lumpectomy Dec. 2010; 1.5 grade 2 DCIS with necrosis. ER/PR+. 2. Radiation therapy form 03/05/2010 - 04/30/2010.  3. Tamoxifen as chemopreventive.    CURRENT THERAPY: Tamoxifen 20 mg daily  INTERIM HISTORY: Overall patient is doing well without any significant problems. She denies any fevers chills night sweats headaches shortness of breath chest pains palpitations no myalgias and arthralgias no vaginal discharge. She does have some hot flashes. Remainder of the 10 point review of systems is negative.  PHYSICAL EXAM: BP 126/73  Pulse 68  Temp(Src) 98.2 F (36.8 C) (Oral)  Resp 20  Ht 5\' 4"  (1.626 m)  Wt 198 lb 4.8 oz (89.948 kg)  BMI 34.02 kg/m2 General: Well developed, well nourished, in no acute distress.  EENT: No ocular or oral lesions. No stomatitis.  Respiratory: Lungs are clear to auscultation bilaterally with normal respiratory movement and no accessory muscle use. Cardiac: No murmur, rub or tachycardia. No upper or lower extremity edema.  GI: Abdomen is soft, no palpable hepatosplenomegaly. No fluid wave. No tenderness. Musculoskeletal: No kyphosis, no tenderness over the spine, ribs or hips. Lymph: No cervical, infraclavicular, axillary or inguinal adenopathy. Neuro: No focal neurological deficits. Psych: Alert and oriented X 3, appropriate mood and affect.  BREAST EXAM: In the supine position, with the right arm over the head, the right nipple is everted. No periareolar edema or nipple discharge. No mass in any quadrant or subareolar region. No redness of the skin. No right axillary  adenopathy. With the left arm over the head, the left nipple is everted. No periareolar edema or nipple discharge. No mass in any quadrant or subareolar region. In the 8 o'clock position, a well healed remote lumpectomy incision, with volume deficit underlying the incision. No redness of the skin. No left axillary adenopathy.     LABORATORY STUDIES:   Results for orders placed in visit on 02/25/13  CBC WITH DIFFERENTIAL      Result Value Range   WBC 8.7  3.9 - 10.3 10e3/uL   NEUT# 6.4  1.5 - 6.5 10e3/uL   HGB 13.2  11.6 - 15.9 g/dL   HCT 40.9  81.1 - 91.4 %   Platelets 246  145 - 400 10e3/uL   MCV 91.3  79.5 - 101.0 fL   MCH 31.8  25.1 - 34.0 pg   MCHC 34.8  31.5 - 36.0 g/dL   RBC 7.82  9.56 - 2.13 10e6/uL   RDW 12.8  11.2 - 14.5 %   lymph# 1.4  0.9 - 3.3 10e3/uL   MONO# 0.5  0.1 - 0.9 10e3/uL   Eosinophils Absolute 0.3  0.0 - 0.5 10e3/uL   Basophils Absolute 0.0  0.0 - 0.1 10e3/uL   NEUT% 74.2  38.4 - 76.8 %   LYMPH% 16.2  14.0 - 49.7 %   MONO% 5.7  0.0 - 14.0 %   EOS% 3.5  0.0 -  7.0 %   BASO% 0.4  0.0 - 2.0 %  COMPREHENSIVE METABOLIC PANEL (CC13)      Result Value Range   Sodium 139  136 - 145 mEq/L   Potassium 3.6  3.5 - 5.1 mEq/L   Chloride 104  98 - 107 mEq/L   CO2 25  22 - 29 mEq/L   Glucose 107 (*) 70 - 99 mg/dl   BUN 16.1  7.0 - 09.6 mg/dL   Creatinine 1.0  0.6 - 1.1 mg/dL   Total Bilirubin 0.45  0.20 - 1.20 mg/dL   Alkaline Phosphatase 55  40 - 150 U/L   AST 12  5 - 34 U/L   ALT 14  0 - 55 U/L   Total Protein 7.3  6.4 - 8.3 g/dL   Albumin 3.7  3.5 - 5.0 g/dL   Calcium 9.0  8.4 - 40.9 mg/dL    IMPRESSION:   72 year old female with DCIS of the left breast status post lumpectomy December 2010 for a 1.5 cm grade 2 DCIS with necrosis ER positive PR positive. Patient afterwards underwent radiation therapy from March 2011 to may 2011. She was then begun on tamoxifen 20 mg daily overall she is tolerating it well.  PLAN:   #1 We will continue tamoxifen for a total of 5  years. Risks and benefits of tamoxifen were discussed with the patient again  #2.I will plan on seeing her back in 6 months time in followup.   Drue Second, MD Medical/Oncology [facility] [phone] (beeper) [phone] (Office)

## 2013-06-02 DIAGNOSIS — B852 Pediculosis, unspecified: Secondary | ICD-10-CM | POA: Diagnosis not present

## 2013-06-23 DIAGNOSIS — R609 Edema, unspecified: Secondary | ICD-10-CM | POA: Diagnosis not present

## 2013-06-23 DIAGNOSIS — M545 Low back pain, unspecified: Secondary | ICD-10-CM | POA: Diagnosis not present

## 2013-06-23 DIAGNOSIS — Z1331 Encounter for screening for depression: Secondary | ICD-10-CM | POA: Diagnosis not present

## 2013-06-23 DIAGNOSIS — F329 Major depressive disorder, single episode, unspecified: Secondary | ICD-10-CM | POA: Diagnosis not present

## 2013-06-23 DIAGNOSIS — I10 Essential (primary) hypertension: Secondary | ICD-10-CM | POA: Diagnosis not present

## 2013-06-23 DIAGNOSIS — F411 Generalized anxiety disorder: Secondary | ICD-10-CM | POA: Diagnosis not present

## 2013-06-23 DIAGNOSIS — E785 Hyperlipidemia, unspecified: Secondary | ICD-10-CM | POA: Diagnosis not present

## 2013-06-23 DIAGNOSIS — Z0000 Encounter for general adult medical examination without abnormal findings: Secondary | ICD-10-CM | POA: Diagnosis not present

## 2013-06-23 DIAGNOSIS — F3289 Other specified depressive episodes: Secondary | ICD-10-CM | POA: Diagnosis not present

## 2013-10-09 ENCOUNTER — Ambulatory Visit (INDEPENDENT_AMBULATORY_CARE_PROVIDER_SITE_OTHER): Payer: Medicare Other | Admitting: Family Medicine

## 2013-10-09 ENCOUNTER — Ambulatory Visit: Payer: Medicare Other

## 2013-10-09 VITALS — BP 132/78 | HR 74 | Temp 98.1°F | Resp 16 | Ht 66.0 in | Wt 193.8 lb

## 2013-10-09 DIAGNOSIS — S0093XA Contusion of unspecified part of head, initial encounter: Secondary | ICD-10-CM

## 2013-10-09 DIAGNOSIS — Z23 Encounter for immunization: Secondary | ICD-10-CM

## 2013-10-09 DIAGNOSIS — S0003XA Contusion of scalp, initial encounter: Secondary | ICD-10-CM

## 2013-10-09 DIAGNOSIS — S52123A Displaced fracture of head of unspecified radius, initial encounter for closed fracture: Secondary | ICD-10-CM | POA: Diagnosis not present

## 2013-10-09 DIAGNOSIS — M25522 Pain in left elbow: Secondary | ICD-10-CM

## 2013-10-09 DIAGNOSIS — S52121A Displaced fracture of head of right radius, initial encounter for closed fracture: Secondary | ICD-10-CM

## 2013-10-09 NOTE — Patient Instructions (Signed)
Wear the splint and sling for comfort.  You may use OTC medications for pain as needed.  Please come and see me on Monday for a recheck- Sooner if worse.

## 2013-10-09 NOTE — Progress Notes (Signed)
[facility] [address] [place] [address] [phone]  Date:  10/09/2013   Name:  Erin Gillespie   DOB:  01-Jul-1941   MRN:  [id number]  PCP:  Lolita Patella, MD    Chief Complaint: Arm Pain and KNot on head   History of Present Illness:  Erin Gillespie is a 72 y.o. very pleasant female patient who presents with the following:  Early this morning she got up to turn out a porch light, tripped and she fell onto her left arm. She is having a hard time turning her left elbow. She also hit the door with her head.  She developed a "goose egg" but put ice on it and it is much better.   She is not sure if she was knocked out but does not think so No urination when she fell.   Her head does not hurt now- besides her elbow she does not think she suffered any injury.  No vomiting.  No confusion noted by her family member who is with her today  Patient Active Problem List   Diagnosis Date Noted  . 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 Hypertension   . Anxiety   . Hypercholesterolemia   . Cancer     Past Surgical History  Procedure Laterality Date  . Cholecystectomy, laparoscopic  1990    History  Substance Use Topics  . Smoking status: Never Smoker   . Smokeless tobacco: Never Used  . Alcohol Use: 1.2 oz/week    2 Shots of liquor per week    Family History  Problem Relation Age of Onset  . Cancer Brother     Allergies  Allergen Reactions  . Codeine Nausea And Vomiting and Rash  . Sulfa Antibiotics Rash    Medication list has been reviewed and updated.  Current Outpatient Prescriptions on File Prior to Visit  Medication Sig Dispense Refill  . ALPRAZolam (XANAX) 0.5 MG tablet Take 0.5 mg by mouth at bedtime as needed. 1/2-1  tablet BID as need for anxiety/sleep      . diltiazem (TIAZAC) 300 MG 24 hr capsule Take 300 mg by mouth daily.      Marland Kitchen lisinopril (PRINIVIL,ZESTRIL) 10 MG tablet  Take 10 mg by mouth daily. 1/2 tablet PO q AM      . naproxen (NAPROSYN) 500 MG tablet Take 500 mg by mouth 2 (two) times daily with a meal.      . pravastatin (PRAVACHOL) 40 MG tablet Take 40 mg by mouth daily.      Marland Kitchen spironolactone (ALDACTONE) 25 MG tablet Take 25 mg by mouth daily.      . tamoxifen (NOLVADEX) 20 MG tablet Take 1 tablet (20 mg total) by mouth daily.  90 tablet  12   No current facility-administered medications on file prior to visit.    Review of Systems:  As per HPI- otherwise negative.   Physical Examination: Filed Vitals:   10/09/13 1443  BP: 132/78  Pulse: 74  Temp: 98.1 F (36.7 C)  Resp: 16   Filed Vitals:   10/09/13 1443  Height: 5\' 6"  (1.676 m)  Weight: 193 lb 12.8 oz (87.907 kg)   Body mass index is 31.3 kg/(m^2). Ideal Body Weight: Weight in (lb) to have BMI = 25: 154.6  GEN: WDWN, NAD, Non-toxic, A & O x 3, overweight, looks well HEENT: Atraumatic, Normocephalic. Neck supple. No masses,  No LAD. Bilateral TM wnl, oropharynx normal.  PEERL,EOMI.   There is a bruise on the left side of her forehead, over the left eye.  No step- off, minimally tender, minimal swelling Ears and Nose: No external deformity. CV: RRR, No M/G/R. No JVD. No thrill. No extra heart sounds. PULM: CTA B, no wheezes, crackles, rhonchi. No retractions. No resp. distress. No accessory muscle use. ABD: S, NT, ND, +BS. No rebound. No HSM. EXTR: No c/c/e NEURO Normal gait.  PSYCH: Normally interactive. Conversant. Not depressed or anxious appearing.  Calm demeanor.  Left elbow: she is tender in the anticubital fossa.  She cannot supinate the hand without pain.  She is able to extend nearly 100% and can flex with little pain.  Hand is NV intact  [facility] reading (PRIMARY) by  Dr. Patsy Lager. Left elbow: suspect radial head fracture.  No definite displaced fracture but she does have a subtle anterior displaced fat pad  LEFT ELBOW - COMPLETE 3+ VIEW  COMPARISON:  None.  FINDINGS: Impacted fracture of the radial head is seen. No other fractures are identified. No evidence of dislocation. A large elbow joint effusion is noted. Mild degenerative spurring of the coronoid process is noted.  IMPRESSION: Impacted radial head fracture with elbow joint effusion.  Placed in a long arm posterior splint and sling.    Assessment and Plan: Pain in left elbow - Plan: DG Elbow Complete Left  Radial head fracture, closed, right, initial encounter  Contusion of head, initial encounter  Follow-up on Monday for presumed radial head fracture.  Ice, OTC pain medications as needed.  Discussed precautions needed for a head injury.  If severe or worsening HA, confusion, vomiting, etc they agree to seek emergency care.   Signed Abbe Amsterdam, MD

## 2013-10-10 ENCOUNTER — Other Ambulatory Visit: Payer: Self-pay | Admitting: *Deleted

## 2013-10-10 ENCOUNTER — Telehealth: Payer: Self-pay

## 2013-10-10 ENCOUNTER — Telehealth: Payer: Self-pay | Admitting: *Deleted

## 2013-10-10 DIAGNOSIS — M25522 Pain in left elbow: Secondary | ICD-10-CM

## 2013-10-10 MED ORDER — NAPROXEN 500 MG PO TABS: 500.0000 mg | ORAL_TABLET | Freq: Two times a day (BID) | ORAL | Status: DC

## 2013-10-10 MED ORDER — PROMETHAZINE HCL 12.5 MG PO TABS: 12.5000 mg | ORAL_TABLET | Freq: Three times a day (TID) | ORAL | Status: DC | PRN

## 2013-10-10 MED ORDER — HYDROCODONE-ACETAMINOPHEN 5-325 MG PO TABS: 1.0000 | ORAL_TABLET | Freq: Three times a day (TID) | ORAL | Status: DC | PRN

## 2013-10-10 NOTE — Telephone Encounter (Signed)
PATIENT STATES SHE WAS IN THE OFFICE TO SEE DR. COPLAND ON Saturday FOR PAIN IN HER LEFT ELBOW. DR. Patsy Lager CALLED HER BACK LAST NIGHT AND TOLD HER THAT IT WAS A FRACTURE. SHE IS REQUESTING SOMETHING FOR PAIN. SHE WAS NOT ABLE TO REST LAST NIGHT BECAUSE SHE WAS SO UNCOMFORTABLE. "OVER-THE-COUNTER" MEDS. ARE NOT HELPING. BEST PHONE [phone] (HOME)   PHARMACY CHOICE IS [facility] ON [address].   MBC

## 2013-10-10 NOTE — Telephone Encounter (Signed)
Called- no answer so LMOM.  I see that she has had naproxen in the past.  I will send in a RF of this for her.  There are other stronger medications but most are related to codeine (to which she is allergic) which is why we did not rx anything like this last night.  However if she does need something stronger than the naproxen we can talk about it and see if there is anything she has been able to tolerate in the past

## 2013-10-10 NOTE — Telephone Encounter (Signed)
Spoke with pt daughter Erin Gillespie and she stated pt has tolerated percocet in the past after she had surgery.  There naproxen has not helped.  She said naproxen does not help.  The codeine only makes her vomit, if you can give her phenergan she can tolerate it.  Her daughter was a little upset about the naproxen but we calmed her down.    Documentation    Erin Cables, MD at 10/10/2013 10:33 AM    Status: Signed        Called- no answer so LMOM. I see that she has had naproxen in the past. I will send in a RF of this for her. There are other stronger medications but most are related to codeine (to which she is allergic) which is why we did not rx anything like this last night. However if she does need something stronger than the naproxen we can talk about it and see if there is anything she has been able to tolerate in the past        Erin Gillespie at 10/10/2013 8:43 AM    Status: Signed        PATIENT STATES SHE WAS IN THE OFFICE TO SEE Erin Gillespie ON Saturday FOR PAIN IN HER LEFT ELBOW. DR. Patsy Gillespie CALLED HER BACK LAST NIGHT AND TOLD HER THAT IT WAS A FRACTURE. SHE IS REQUESTING SOMETHING FOR PAIN. SHE WAS NOT ABLE TO REST LAST NIGHT BECAUSE SHE WAS SO UNCOMFORTABLE. "OVER-THE-COUNTER" MEDS. ARE NOT HELPING.  BEST PHONE [phone] (HOME) PHARMACY CHOICE IS [facility] ON [address]. MBC

## 2013-10-10 NOTE — Telephone Encounter (Signed)
Pt daughter notified that rx was ready for pickup and that rx for nausea sent.

## 2013-10-10 NOTE — Telephone Encounter (Signed)
Rx for small amount of Norco printed. Phenergan sent to pharmacy

## 2013-10-11 ENCOUNTER — Ambulatory Visit (INDEPENDENT_AMBULATORY_CARE_PROVIDER_SITE_OTHER): Payer: Medicare Other | Admitting: Family Medicine

## 2013-10-11 VITALS — BP 116/82 | HR 66 | Temp 98.2°F | Resp 18 | Ht 66.0 in | Wt 195.4 lb

## 2013-10-11 DIAGNOSIS — S5290XD Unspecified fracture of unspecified forearm, subsequent encounter for closed fracture with routine healing: Secondary | ICD-10-CM

## 2013-10-11 DIAGNOSIS — S52122D Displaced fracture of head of left radius, subsequent encounter for closed fracture with routine healing: Secondary | ICD-10-CM

## 2013-10-11 NOTE — Progress Notes (Signed)
[facility] [address] [place] [address] [phone]  Date:  10/11/2013   Name:  Erin Gillespie   DOB:  [birth year]   MRN:  [id number]  PCP:  Lolita Patella, MD    Chief Complaint: Follow-up   History of Present Illness:  Erin Gillespie is a 72 y.o. very pleasant female patient who presents with the following:  She is here today to recheck her left raidal head fracture.  She has used some naproxen but it was helpful.  She is now taking norco and phenergan.  Her pain is better controlled and overall she feels well.  Her head contusion continues to do well.   Patient Active Problem List   Diagnosis Date Noted  . 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 Hypertension   . Anxiety   . Hypercholesterolemia   . Cancer     Past Surgical History  Procedure Laterality Date  . Cholecystectomy, laparoscopic  1990    History  Substance Use Topics  . Smoking status: Never Smoker   . Smokeless tobacco: Never Used  . Alcohol Use: 1.2 oz/week    2 Shots of liquor per week    Family History  Problem Relation Age of Onset  . Cancer Brother     Allergies  Allergen Reactions  . Codeine Nausea And Vomiting and Rash  . Sulfa Antibiotics Rash    Medication list has been reviewed and updated.  Current Outpatient Prescriptions on File Prior to Visit  Medication Sig Dispense Refill  . ALPRAZolam (XANAX) 0.5 MG tablet Take 0.5 mg by mouth at bedtime as needed. 1/2-1  tablet BID as need for anxiety/sleep      . diltiazem (TIAZAC) 300 MG 24 hr capsule Take 300 mg by mouth daily.      Marland Kitchen HYDROcodone-acetaminophen (NORCO) 5-325 MG per tablet Take 1 tablet by mouth every 8 (eight) hours as needed for pain.  20 tablet  0  . lisinopril (PRINIVIL,ZESTRIL) 10 MG tablet Take 10 mg by mouth daily. 1/2 tablet PO q AM      . naproxen (NAPROSYN) 500 MG tablet Take 1 tablet (500 mg total) by mouth 2 (two) times daily  with a meal.  30 tablet  1  . pravastatin (PRAVACHOL) 40 MG tablet Take 40 mg by mouth daily.      . promethazine (PHENERGAN) 12.5 MG tablet Take 1 tablet (12.5 mg total) by mouth every 8 (eight) hours as needed for nausea.  20 tablet  0  . spironolactone (ALDACTONE) 25 MG tablet Take 25 mg by mouth daily.      . tamoxifen (NOLVADEX) 20 MG tablet Take 1 tablet (20 mg total) by mouth daily.  90 tablet  12   No current facility-administered medications on file prior to visit.    Review of Systems:  As per HPI- otherwise negative.   Physical Examination: Filed Vitals:   10/11/13 1147  BP: 116/82  Pulse: 66  Temp: 98.2 F (36.8 C)  Resp: 18   Filed Vitals:   10/11/13 1147  Height: 5\' 6"  (1.676 m)  Weight: 195 lb 6.4 oz (88.633 kg)   Body mass index is 31.55 kg/(m^2). Ideal Body Weight: Weight in (lb) to have BMI = 25: 154.6  GEN: WDWN, NAD, Non-toxic, A & O x 3 HEENT: Atraumatic, Normocephalic. Neck supple. No masses, No LAD. Ears and Nose: No external deformity. CV: RRR, No  M/G/R. No JVD. No thrill. No extra heart sounds. PULM: CTA B, no wheezes, crackles, rhonchi. No retractions. No resp. distress. No accessory muscle use. EXTR: No c/c/e NEURO Normal gait.  PSYCH: Normally interactive. Conversant. Not depressed or anxious appearing.  Calm demeanor.  Left elbow: removed splint.  Her swelling, bruising and pain is much improved.  Her ROM is better.  She is able to pronate and supinate.  She can extend about 170 degrees, and can flex to 45 degrees  Assessment and Plan: Left radial head fracture, closed, with routine healing, subsequent encounter  Improved pain.  D/c splint, and use sling as needed for comfort.  Work on ROM exercises.  Recheck on Friday for repeat films   Signed Abbe Amsterdam, MD

## 2013-10-11 NOTE — Telephone Encounter (Signed)
She has come back in. today

## 2013-10-11 NOTE — Telephone Encounter (Signed)
See Dr. Cyndie Chime note below

## 2013-10-11 NOTE — Patient Instructions (Signed)
You may use the splint if needed for comfort.  However, if the sling is enough this is ok too.   Work on forearm rotation as your pain allows.  You can also do gentle flexion and extension of the elbow.  Please come and see me on Friday morning for a repeat X-ray.

## 2013-12-01 DIAGNOSIS — F3289 Other specified depressive episodes: Secondary | ICD-10-CM | POA: Diagnosis not present

## 2013-12-01 DIAGNOSIS — F329 Major depressive disorder, single episode, unspecified: Secondary | ICD-10-CM | POA: Diagnosis not present

## 2013-12-01 DIAGNOSIS — E782 Mixed hyperlipidemia: Secondary | ICD-10-CM | POA: Diagnosis not present

## 2013-12-01 DIAGNOSIS — M545 Low back pain, unspecified: Secondary | ICD-10-CM | POA: Diagnosis not present

## 2013-12-01 DIAGNOSIS — F411 Generalized anxiety disorder: Secondary | ICD-10-CM | POA: Diagnosis not present

## 2013-12-01 DIAGNOSIS — M899 Disorder of bone, unspecified: Secondary | ICD-10-CM | POA: Diagnosis not present

## 2013-12-01 DIAGNOSIS — R609 Edema, unspecified: Secondary | ICD-10-CM | POA: Diagnosis not present

## 2013-12-01 DIAGNOSIS — I10 Essential (primary) hypertension: Secondary | ICD-10-CM | POA: Diagnosis not present

## 2014-02-08 ENCOUNTER — Telehealth: Payer: Self-pay | Admitting: Oncology

## 2014-02-08 NOTE — Telephone Encounter (Signed)
, °

## 2014-02-23 ENCOUNTER — Encounter: Payer: Self-pay | Admitting: Hematology and Oncology

## 2014-02-23 ENCOUNTER — Ambulatory Visit (HOSPITAL_BASED_OUTPATIENT_CLINIC_OR_DEPARTMENT_OTHER): Payer: Medicare Other | Admitting: Hematology and Oncology

## 2014-02-23 ENCOUNTER — Ambulatory Visit: Payer: Medicare Other | Admitting: Oncology

## 2014-02-23 ENCOUNTER — Other Ambulatory Visit (HOSPITAL_BASED_OUTPATIENT_CLINIC_OR_DEPARTMENT_OTHER): Payer: Medicare Other

## 2014-02-23 VITALS — BP 116/77 | HR 66 | Temp 98.3°F | Resp 18 | Ht 66.0 in | Wt 187.9 lb

## 2014-02-23 DIAGNOSIS — D0590 Unspecified type of carcinoma in situ of unspecified breast: Secondary | ICD-10-CM | POA: Diagnosis not present

## 2014-02-23 DIAGNOSIS — Z170 Estrogen receptor positive status [ER+]: Secondary | ICD-10-CM | POA: Diagnosis not present

## 2014-02-23 DIAGNOSIS — D0512 Intraductal carcinoma in situ of left breast: Secondary | ICD-10-CM

## 2014-02-23 DIAGNOSIS — D0510 Intraductal carcinoma in situ of unspecified breast: Secondary | ICD-10-CM

## 2014-02-23 LAB — CBC WITH DIFFERENTIAL/PLATELET
BASO%: 0.7 % (ref 0.0–2.0)
BASOS ABS: 0.1 10*3/uL (ref 0.0–0.1)
EOS ABS: 0.3 10*3/uL (ref 0.0–0.5)
EOS%: 3.3 % (ref 0.0–7.0)
HEMATOCRIT: 39.9 % (ref 34.8–46.6)
HEMOGLOBIN: 13.6 g/dL (ref 11.6–15.9)
LYMPH%: 18.7 % (ref 14.0–49.7)
MCH: 31.7 pg (ref 25.1–34.0)
MCHC: 34.2 g/dL (ref 31.5–36.0)
MCV: 92.7 fL (ref 79.5–101.0)
MONO#: 0.5 10*3/uL (ref 0.1–0.9)
MONO%: 6.4 % (ref 0.0–14.0)
NEUT#: 5.5 10*3/uL (ref 1.5–6.5)
NEUT%: 70.9 % (ref 38.4–76.8)
PLATELETS: 305 10*3/uL (ref 145–400)
RBC: 4.3 10*6/uL (ref 3.70–5.45)
RDW: 12.6 % (ref 11.2–14.5)
WBC: 7.7 10*3/uL (ref 3.9–10.3)
lymph#: 1.4 10*3/uL (ref 0.9–3.3)

## 2014-02-23 LAB — COMPREHENSIVE METABOLIC PANEL (CC13)
ALT: 11 U/L (ref 0–55)
ANION GAP: 9 meq/L (ref 3–11)
AST: 11 U/L (ref 5–34)
Albumin: 3.9 g/dL (ref 3.5–5.0)
Alkaline Phosphatase: 50 U/L (ref 40–150)
BUN: 12.5 mg/dL (ref 7.0–26.0)
CO2: 25 meq/L (ref 22–29)
Calcium: 9.5 mg/dL (ref 8.4–10.4)
Chloride: 105 mEq/L (ref 98–109)
Creatinine: 0.9 mg/dL (ref 0.6–1.1)
Glucose: 101 mg/dl (ref 70–140)
Potassium: 3.9 mEq/L (ref 3.5–5.1)
Sodium: 139 mEq/L (ref 136–145)
Total Bilirubin: 0.45 mg/dL (ref 0.20–1.20)
Total Protein: 7.4 g/dL (ref 6.4–8.3)

## 2014-02-23 NOTE — Progress Notes (Signed)
[place]  Name: Erin Gillespie                  DATE: 02/23/2014 MRN: [id number]                      DOB: [birth year]  DIAGNOSIS: Patient Active Problem List   Diagnosis Date Noted  . 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  . 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 Left lumpectomy Dec. 2010; 1.5 grade 2 DCIS with necrosis. ER/PR+. 2. Radiation therapy form 03/05/2010 - 04/30/2010.  3. Tamoxifen as chemopreventive.    CURRENT THERAPY: Tamoxifen 20 mg daily  INTERIM HISTORY: Overall patient is doing well without any significant problems. She denies any fevers chills night sweats headaches shortness of breath chest pains palpitations no myalgias and arthralgias no vaginal discharge. She does have some hot flashes. She has not kept up with her  Mammogram or pelvic exam.She needs bone density scheduled.She had fracture of her left arm and eye surgeries which delayed above testing she states. Impacted left radial head fracture  10/09/2013.  Remainder of the 10 point review of systems is negative.  PHYSICAL EXAM: BP 116/77  Pulse 66  Temp(Src) 98.3 F (36.8 C) (Oral)  Resp 18  Ht 5\' 6"  (1.676 m)  Wt 187 lb 14.4 oz (85.231 kg)  BMI 30.34 kg/m2 General: Well developed, well nourished, in no acute distress.  EENT: No ocular or oral lesions. No stomatitis.  Respiratory: Lungs are clear to auscultation bilaterally with normal respiratory movement and no accessory muscle use. Cardiac: No murmur, rub or tachycardia. No upper or lower extremity edema.  GI: Abdomen is soft, no palpable hepatosplenomegaly. No fluid wave. No tenderness. Musculoskeletal: No kyphosis, no tenderness over the spine, ribs or hips. Lymph: No cervical, infraclavicular, axillary or inguinal adenopathy. Neuro: No focal neurological deficits. Psych: Alert and oriented X 3, appropriate mood and affect.  BREAST EXAM: In the supine  position, with the right arm over the head, the right nipple is everted. No periareolar edema or nipple discharge. No mass in any quadrant or subareolar region. No redness of the skin. No right axillary adenopathy. With the left arm over the head, the left nipple is everted. No periareolar edema or nipple discharge. No mass in any quadrant or subareolar region. In the 8 o'clock position, a well healed remote lumpectomy incision, with volume deficit underlying the incision. No redness of the skin. No left axillary adenopathy. There is an abberant extra nipple present in the dependent part of her left breast which is normal in appearance.    LABORATORY STUDIES:   Results for orders placed in visit on 02/23/14  CBC WITH DIFFERENTIAL      Result Value Ref Range   WBC 7.7  3.9 - 10.3 10e3/uL   NEUT# 5.5  1.5 - 6.5 10e3/uL   HGB 13.6  11.6 - 15.9 g/dL   HCT 39.9  34.8 - 46.6 %   Platelets 305  145 - 400 10e3/uL   MCV 92.7  79.5 - 101.0 fL   MCH 31.7  25.1 - 34.0 pg   MCHC 34.2  31.5 - 36.0 g/dL   RBC 4.30  3.70 - 5.45 10e6/uL   RDW 12.6  11.2 - 14.5 %   lymph# 1.4  0.9 - 3.3 10e3/uL   MONO# 0.5  0.1 - 0.9 10e3/uL  Eosinophils Absolute 0.3  0.0 - 0.5 10e3/uL   Basophils Absolute 0.1  0.0 - 0.1 10e3/uL   NEUT% 70.9  38.4 - 76.8 %   LYMPH% 18.7  14.0 - 49.7 %   MONO% 6.4  0.0 - 14.0 %   EOS% 3.3  0.0 - 7.0 %   BASO% 0.7  0.0 - 2.0 %  COMPREHENSIVE METABOLIC PANEL ([place])      Result Value Ref Range   Sodium 139  136 - 145 mEq/L   Potassium 3.9  3.5 - 5.1 mEq/L   Chloride 105  98 - 109 mEq/L   CO2 25  22 - 29 mEq/L   Glucose 101  70 - 140 mg/dl   BUN 12.5  7.0 - 26.0 mg/dL   Creatinine 0.9  0.6 - 1.1 mg/dL   Total Bilirubin 0.45  0.20 - 1.20 mg/dL   Alkaline Phosphatase 50  40 - 150 U/L   AST 11  5 - 34 U/L   ALT 11  0 - 55 U/L   Total Protein 7.4  6.4 - 8.3 g/dL   Albumin 3.9  3.5 - 5.0 g/dL   Calcium 9.5  8.4 - 10.4 mg/dL   Anion Gap 9  3 - 11 mEq/L    IMPRESSION:   73 year old  female with DCIS of the left breast status post lumpectomy December 2010 for a 1.5 cm grade 2 DCIS with necrosis ER positive PR positive. Patient afterwards underwent radiation therapy from March 2011 to May 2011. She was then begun on tamoxifen 20 mg daily overall she is tolerating it well.  PLAN:   #1 We will continue tamoxifen for a total of 5 years. Risks and benefits of tamoxifen were discussed with the patient again  #2.Will plan on seeing her back in  1 year time in followup with CBC and CMP.  Patient plans to have her mammogram due now through the [facility] scheduled by her Primary MD.There are no mammograms on system for review.  In addition she needs pelvic exam annual while on tamoxifen  and discussed this again with patient. Needs bone density and colonoscopy  Advised her to take Calcium 600 mg 2 tabl daily and  Vit D3 2000IU one daily in addition to her multivitamin she is on.  Patient to call us for any new concerns.  Amada Kingfisher, M.D. Oncology/Hematology [place] [phone] (Office) 02/23/2014

## 2014-02-24 ENCOUNTER — Telehealth: Payer: Self-pay | Admitting: Oncology

## 2014-02-24 NOTE — Telephone Encounter (Signed)
, °

## 2014-03-01 DIAGNOSIS — D0590 Unspecified type of carcinoma in situ of unspecified breast: Secondary | ICD-10-CM | POA: Diagnosis not present

## 2014-06-29 DIAGNOSIS — M899 Disorder of bone, unspecified: Secondary | ICD-10-CM | POA: Diagnosis not present

## 2014-06-29 DIAGNOSIS — M949 Disorder of cartilage, unspecified: Secondary | ICD-10-CM | POA: Diagnosis not present

## 2014-06-30 DIAGNOSIS — Z0000 Encounter for general adult medical examination without abnormal findings: Secondary | ICD-10-CM | POA: Diagnosis not present

## 2014-06-30 DIAGNOSIS — Z23 Encounter for immunization: Secondary | ICD-10-CM | POA: Diagnosis not present

## 2014-06-30 DIAGNOSIS — F3289 Other specified depressive episodes: Secondary | ICD-10-CM | POA: Diagnosis not present

## 2014-06-30 DIAGNOSIS — F329 Major depressive disorder, single episode, unspecified: Secondary | ICD-10-CM | POA: Diagnosis not present

## 2014-06-30 DIAGNOSIS — F411 Generalized anxiety disorder: Secondary | ICD-10-CM | POA: Diagnosis not present

## 2014-06-30 DIAGNOSIS — M545 Low back pain, unspecified: Secondary | ICD-10-CM | POA: Diagnosis not present

## 2014-06-30 DIAGNOSIS — R609 Edema, unspecified: Secondary | ICD-10-CM | POA: Diagnosis not present

## 2014-06-30 DIAGNOSIS — E785 Hyperlipidemia, unspecified: Secondary | ICD-10-CM | POA: Diagnosis not present

## 2014-06-30 DIAGNOSIS — I10 Essential (primary) hypertension: Secondary | ICD-10-CM | POA: Diagnosis not present

## 2014-08-11 DIAGNOSIS — D0470 Carcinoma in situ of skin of unspecified lower limb, including hip: Secondary | ICD-10-CM | POA: Diagnosis not present

## 2014-08-11 DIAGNOSIS — Z23 Encounter for immunization: Secondary | ICD-10-CM | POA: Diagnosis not present

## 2014-08-11 DIAGNOSIS — C44721 Squamous cell carcinoma of skin of unspecified lower limb, including hip: Secondary | ICD-10-CM | POA: Diagnosis not present

## 2014-08-17 ENCOUNTER — Other Ambulatory Visit: Payer: Self-pay | Admitting: Oncology

## 2014-08-17 DIAGNOSIS — D0590 Unspecified type of carcinoma in situ of unspecified breast: Secondary | ICD-10-CM

## 2014-08-29 DIAGNOSIS — C44721 Squamous cell carcinoma of skin of unspecified lower limb, including hip: Secondary | ICD-10-CM | POA: Diagnosis not present

## 2014-08-29 DIAGNOSIS — L821 Other seborrheic keratosis: Secondary | ICD-10-CM | POA: Diagnosis not present

## 2014-11-22 ENCOUNTER — Telehealth: Payer: Self-pay | Admitting: Hematology and Oncology

## 2014-11-22 NOTE — Telephone Encounter (Signed)
, °

## 2015-02-20 DIAGNOSIS — M8580 Other specified disorders of bone density and structure, unspecified site: Secondary | ICD-10-CM | POA: Diagnosis not present

## 2015-02-20 DIAGNOSIS — R609 Edema, unspecified: Secondary | ICD-10-CM | POA: Diagnosis not present

## 2015-02-20 DIAGNOSIS — I10 Essential (primary) hypertension: Secondary | ICD-10-CM | POA: Diagnosis not present

## 2015-02-20 DIAGNOSIS — D0510 Intraductal carcinoma in situ of unspecified breast: Secondary | ICD-10-CM | POA: Diagnosis not present

## 2015-02-20 DIAGNOSIS — M545 Low back pain: Secondary | ICD-10-CM | POA: Diagnosis not present

## 2015-02-20 DIAGNOSIS — F324 Major depressive disorder, single episode, in partial remission: Secondary | ICD-10-CM | POA: Diagnosis not present

## 2015-02-20 DIAGNOSIS — F419 Anxiety disorder, unspecified: Secondary | ICD-10-CM | POA: Diagnosis not present

## 2015-02-20 DIAGNOSIS — E780 Pure hypercholesterolemia: Secondary | ICD-10-CM | POA: Diagnosis not present

## 2015-02-23 ENCOUNTER — Other Ambulatory Visit: Payer: Self-pay | Admitting: *Deleted

## 2015-02-23 DIAGNOSIS — D0510 Intraductal carcinoma in situ of unspecified breast: Secondary | ICD-10-CM

## 2015-02-24 ENCOUNTER — Other Ambulatory Visit (HOSPITAL_BASED_OUTPATIENT_CLINIC_OR_DEPARTMENT_OTHER): Payer: Medicare Other

## 2015-02-24 ENCOUNTER — Ambulatory Visit (HOSPITAL_BASED_OUTPATIENT_CLINIC_OR_DEPARTMENT_OTHER): Payer: Medicare Other | Admitting: Hematology and Oncology

## 2015-02-24 ENCOUNTER — Telehealth: Payer: Self-pay | Admitting: Hematology and Oncology

## 2015-02-24 VITALS — BP 130/78 | HR 54 | Temp 97.7°F | Resp 18 | Ht 66.0 in | Wt 183.7 lb

## 2015-02-24 DIAGNOSIS — D0512 Intraductal carcinoma in situ of left breast: Secondary | ICD-10-CM

## 2015-02-24 DIAGNOSIS — D0510 Intraductal carcinoma in situ of unspecified breast: Secondary | ICD-10-CM

## 2015-02-24 DIAGNOSIS — Z170 Estrogen receptor positive status [ER+]: Secondary | ICD-10-CM

## 2015-02-24 DIAGNOSIS — D0590 Unspecified type of carcinoma in situ of unspecified breast: Secondary | ICD-10-CM

## 2015-02-24 LAB — COMPREHENSIVE METABOLIC PANEL (CC13)
ALBUMIN: 3.9 g/dL (ref 3.5–5.0)
ALT: 12 U/L (ref 0–55)
AST: 13 U/L (ref 5–34)
Alkaline Phosphatase: 53 U/L (ref 40–150)
Anion Gap: 11 mEq/L (ref 3–11)
BILIRUBIN TOTAL: 0.44 mg/dL (ref 0.20–1.20)
BUN: 13.7 mg/dL (ref 7.0–26.0)
CO2: 24 mEq/L (ref 22–29)
CREATININE: 0.9 mg/dL (ref 0.6–1.1)
Calcium: 9.9 mg/dL (ref 8.4–10.4)
Chloride: 105 mEq/L (ref 98–109)
EGFR: 68 mL/min/{1.73_m2} — AB (ref >90)
Glucose: 93 mg/dl (ref 70–140)
Potassium: 4.1 mEq/L (ref 3.5–5.1)
SODIUM: 140 meq/L (ref 136–145)
Total Protein: 7.4 g/dL (ref 6.4–8.3)

## 2015-02-24 LAB — CBC WITH DIFFERENTIAL/PLATELET
BASO%: 0.3 % (ref 0.0–2.0)
BASOS ABS: 0 10*3/uL (ref 0.0–0.1)
EOS ABS: 0.7 10*3/uL — AB (ref 0.0–0.5)
EOS%: 6.8 % (ref 0.0–7.0)
HCT: 41.8 % (ref 34.8–46.6)
HGB: 14.2 g/dL (ref 11.6–15.9)
LYMPH%: 14.4 % (ref 14.0–49.7)
MCH: 31.3 pg (ref 25.1–34.0)
MCHC: 34 g/dL (ref 31.5–36.0)
MCV: 92.3 fL (ref 79.5–101.0)
MONO#: 0.7 10*3/uL (ref 0.1–0.9)
MONO%: 6.4 % (ref 0.0–14.0)
NEUT#: 7.4 10*3/uL — ABNORMAL HIGH (ref 1.5–6.5)
NEUT%: 72.1 % (ref 38.4–76.8)
PLATELETS: 315 10*3/uL (ref 145–400)
RBC: 4.53 10*6/uL (ref 3.70–5.45)
RDW: 12.8 % (ref 11.2–14.5)
WBC: 10.3 10*3/uL (ref 3.9–10.3)
lymph#: 1.5 10*3/uL (ref 0.9–3.3)

## 2015-02-24 MED ORDER — TAMOXIFEN CITRATE 20 MG PO TABS: 20.0000 mg | ORAL_TABLET | Freq: Every day | ORAL | Status: DC

## 2015-02-24 NOTE — Telephone Encounter (Signed)
Appointments made and avs printed for patient °

## 2015-02-24 NOTE — Progress Notes (Signed)
Patient Care Team: Maury Dus, MD as PCP - General (Family Medicine) Amada Kingfisher, MD as Consulting Physician (Hematology and Oncology)  DIAGNOSIS:   PREVIOUS THERAPY:  1. Left lumpectomy Dec. 2010; 1.5 grade 2 DCIS with necrosis. ER/PR+. 2. Radiation therapy form 03/05/2010 - 04/30/2010.   CURRENT THERAPY: Tamoxifen   CHIEF COMPLIANT: Follow-up of DCIS on tamoxifen INTERVAL HISTORY: Erin Gillespie is a 74 year old lady with above-mentioned history of left breast DCIS who is here 5 years after being diagnosed with left breast DCIS. She reports no problems tolerating tamoxifen. Denies any hot flashes or myalgias.  REVIEW OF SYSTEMS:   Constitutional: Denies fevers, chills or abnormal weight loss Eyes: Denies blurriness of vision Ears, nose, mouth, throat, and face: Denies mucositis or sore throat Respiratory: Denies cough, dyspnea or wheezes Cardiovascular: Denies palpitation, chest discomfort or lower extremity swelling Gastrointestinal:  Denies nausea, heartburn or change in bowel habits Skin: Denies abnormal skin rashes Lymphatics: Denies new lymphadenopathy or easy bruising Neurological:Denies numbness, tingling or new weaknesses Behavioral/Psych: Mood is stable, no new changes  Breast:  denies any pain or lumps or nodules in either breasts All other systems were reviewed with the patient and are negative.  I have reviewed the past medical history, past surgical history, social history and family history with the patient and they are unchanged from previous note.  ALLERGIES:  is allergic to codeine and sulfa antibiotics.  MEDICATIONS:  Current Outpatient Prescriptions  Medication Sig Dispense Refill  . ALPRAZolam (XANAX) 0.5 MG tablet Take 0.5 mg by mouth at bedtime as needed. 1/2-1  tablet BID as need for anxiety/sleep    . diltiazem (TIAZAC) 300 MG 24 hr capsule Take 300 mg by mouth daily.    Marland Kitchen HYDROcodone-acetaminophen (NORCO) 5-325 MG per tablet Take 1 tablet by mouth  every 8 (eight) hours as needed for pain. 20 tablet 0  . lisinopril (PRINIVIL,ZESTRIL) 10 MG tablet Take 10 mg by mouth daily. 1/2 tablet PO q AM    . naproxen (NAPROSYN) 500 MG tablet Take 1 tablet (500 mg total) by mouth 2 (two) times daily with a meal. 30 tablet 1  . pravastatin (PRAVACHOL) 40 MG tablet Take 40 mg by mouth daily.    . promethazine (PHENERGAN) 12.5 MG tablet Take 1 tablet (12.5 mg total) by mouth every 8 (eight) hours as needed for nausea. 20 tablet 0  . spironolactone (ALDACTONE) 25 MG tablet Take 25 mg by mouth daily.    . tamoxifen (NOLVADEX) 20 MG tablet Take 1 tablet (20 mg total) by mouth daily. 90 tablet 3   No current facility-administered medications for this visit.    PHYSICAL EXAMINATION: ECOG PERFORMANCE STATUS: 0 - Asymptomatic  Filed Vitals:   02/24/15 1015  BP: 130/78  Pulse: 54  Temp: 97.7 F (36.5 C)  Resp: 18   Filed Weights   02/24/15 1015  Weight: 183 lb 11.2 oz (83.326 kg)    GENERAL:alert, no distress and comfortable SKIN: skin color, texture, turgor are normal, no rashes or significant lesions EYES: normal, Conjunctiva are pink and non-injected, sclera clear OROPHARYNX:no exudate, no erythema and lips, buccal mucosa, and tongue normal  NECK: supple, thyroid normal size, non-tender, without nodularity LYMPH:  no palpable lymphadenopathy in the cervical, axillary or inguinal LUNGS: clear to auscultation and percussion with normal breathing effort HEART: regular rate & rhythm and no murmurs and no lower extremity edema ABDOMEN:abdomen soft, non-tender and normal bowel sounds Musculoskeletal:no cyanosis of digits and no clubbing  NEURO: alert & oriented  x 3 with fluent speech, no focal motor/sensory deficits BREAST: No palpable masses or nodules in either right or left breasts. No palpable axillary supraclavicular or infraclavicular adenopathy no breast tenderness or nipple discharge. (exam performed in the presence of a  chaperone)  LABORATORY DATA:  I have reviewed the data as listed   Chemistry      Component Value Date/Time   NA 140 02/24/2015 0957   NA 138 02/20/2012 1001   K 4.1 02/24/2015 0957   K 4.2 02/20/2012 1001   CL 104 02/25/2013 1000   CL 102 02/20/2012 1001   CO2 24 02/24/2015 0957   CO2 24 02/20/2012 1001   BUN 13.7 02/24/2015 0957   BUN 22 02/20/2012 1001   CREATININE 0.9 02/24/2015 0957   CREATININE 0.91 02/20/2012 1001      Component Value Date/Time   CALCIUM 9.9 02/24/2015 0957   CALCIUM 9.6 02/20/2012 1001   ALKPHOS 53 02/24/2015 0957   ALKPHOS 50 02/20/2012 1001   AST 13 02/24/2015 0957   AST 12 02/20/2012 1001   ALT 12 02/24/2015 0957   ALT 11 02/20/2012 1001   BILITOT 0.44 02/24/2015 0957   BILITOT 0.4 02/20/2012 1001       Lab Results  Component Value Date   WBC 10.3 02/24/2015   HGB 14.2 02/24/2015   HCT 41.8 02/24/2015   MCV 92.3 02/24/2015   PLT 315 02/24/2015   NEUTROABS 7.4* 02/24/2015   ASSESSMENT & PLAN:  Ductal carcinoma in situ (DCIS) of left breast Left breast DCIS high-grade with calcifications 1.5 cm status post lumpectomy 01/01/2010, ER 99%, PR 100% status post radiation therapy on 03/05/2010 to 04/30/2010, currently on tamoxifen 20 mg daily from June 2011  Tamoxifen toxicities: No major toxicities to tamoxifen. I discussed with her that she would finish 5 years of tamoxifen by June 2016. She can complete 5 years of therapy and discontinued treatment.   Breast cancer surveillance: 1. Breast exam 02/24/2015 is normal 2. Mammogram 03/01/2014 is normal. Patient will be scheduled for another mammogram this month.  Survivorship:Discussed the importance of physical exercise in decreasing the likelihood of breast cancer recurrence. Recommended 30 mins daily 6 days a week of either brisk walking or cycling or swimming. Encouraged patient to eat more fruits and vegetables and decrease red meat.   No orders of the defined types were placed in this  encounter.   The patient has a good understanding of the overall plan. she agrees with it. She will call with any problems that may develop before her next visit here.   Rulon Eisenmenger, MD

## 2015-02-24 NOTE — Assessment & Plan Note (Addendum)
Left breast DCIS high-grade with calcifications 1.5 cm status post lumpectomy 01/01/2010, ER 99%, PR 100% status post radiation therapy on 03/05/2010 to 04/30/2010, currently on tamoxifen 20 mg daily from June 2011  Tamoxifen toxicities:  Breast cancer surveillance: 1. Breast exam 02/24/2015 is normal 2. Mammogram 03/01/2014 is normal. Patient will be scheduled for another mammogram this month.  Survivorship:Discussed the importance of physical exercise in decreasing the likelihood of breast cancer recurrence. Recommended 30 mins daily 6 days a week of either brisk walking or cycling or swimming. Encouraged patient to eat more fruits and vegetables and decrease red meat.

## 2015-05-25 ENCOUNTER — Encounter (HOSPITAL_COMMUNITY): Payer: Self-pay

## 2015-05-25 ENCOUNTER — Emergency Department (HOSPITAL_COMMUNITY): Payer: Medicare Other

## 2015-05-25 ENCOUNTER — Emergency Department (HOSPITAL_COMMUNITY)
Admission: EM | Admit: 2015-05-25 | Discharge: 2015-05-25 | Disposition: A | Discharge status: Home / Self Care | Payer: Medicare Other | Attending: Emergency Medicine | Admitting: Emergency Medicine

## 2015-05-25 DIAGNOSIS — I10 Essential (primary) hypertension: Secondary | ICD-10-CM | POA: Insufficient documentation

## 2015-05-25 DIAGNOSIS — Z791 Long term (current) use of non-steroidal anti-inflammatories (NSAID): Secondary | ICD-10-CM | POA: Insufficient documentation

## 2015-05-25 DIAGNOSIS — R531 Weakness: Secondary | ICD-10-CM | POA: Diagnosis not present

## 2015-05-25 DIAGNOSIS — R112 Nausea with vomiting, unspecified: Secondary | ICD-10-CM

## 2015-05-25 DIAGNOSIS — R404 Transient alteration of awareness: Secondary | ICD-10-CM | POA: Diagnosis not present

## 2015-05-25 DIAGNOSIS — E780 Pure hypercholesterolemia: Secondary | ICD-10-CM | POA: Diagnosis not present

## 2015-05-25 DIAGNOSIS — R61 Generalized hyperhidrosis: Secondary | ICD-10-CM | POA: Diagnosis not present

## 2015-05-25 DIAGNOSIS — Z853 Personal history of malignant neoplasm of breast: Secondary | ICD-10-CM | POA: Diagnosis not present

## 2015-05-25 DIAGNOSIS — F419 Anxiety disorder, unspecified: Secondary | ICD-10-CM | POA: Diagnosis not present

## 2015-05-25 DIAGNOSIS — Z79899 Other long term (current) drug therapy: Secondary | ICD-10-CM | POA: Diagnosis not present

## 2015-05-25 LAB — COMPREHENSIVE METABOLIC PANEL
ALT: 14 U/L (ref 14–54)
AST: 19 U/L (ref 15–41)
Albumin: 3.8 g/dL (ref 3.5–5.0)
Alkaline Phosphatase: 54 U/L (ref 38–126)
Anion gap: 12 (ref 5–15)
BUN: 16 mg/dL (ref 6–20)
CALCIUM: 9 mg/dL (ref 8.9–10.3)
CO2: 25 mmol/L (ref 22–32)
CREATININE: 0.96 mg/dL (ref 0.44–1.00)
Chloride: 99 mmol/L — ABNORMAL LOW (ref 101–111)
GFR calc non Af Amer: 58 mL/min — ABNORMAL LOW (ref >60)
GLUCOSE: 145 mg/dL — AB (ref 65–99)
Potassium: 3.9 mmol/L (ref 3.5–5.1)
Sodium: 136 mmol/L (ref 135–145)
Total Bilirubin: 0.8 mg/dL (ref 0.3–1.2)
Total Protein: 7.5 g/dL (ref 6.5–8.1)

## 2015-05-25 LAB — CBC WITH DIFFERENTIAL/PLATELET
Basophils Absolute: 0 10*3/uL (ref 0.0–0.1)
Basophils Relative: 0 % (ref 0–1)
EOS PCT: 0 % (ref 0–5)
Eosinophils Absolute: 0 10*3/uL (ref 0.0–0.7)
HCT: 42.9 % (ref 36.0–46.0)
HEMOGLOBIN: 14.9 g/dL (ref 12.0–15.0)
LYMPHS ABS: 0.5 10*3/uL — AB (ref 0.7–4.0)
LYMPHS PCT: 3 % — AB (ref 12–46)
MCH: 31.3 pg (ref 26.0–34.0)
MCHC: 34.7 g/dL (ref 30.0–36.0)
MCV: 90.1 fL (ref 78.0–100.0)
MONO ABS: 0.4 10*3/uL (ref 0.1–1.0)
Monocytes Relative: 2 % — ABNORMAL LOW (ref 3–12)
NEUTROS ABS: 15.8 10*3/uL — AB (ref 1.7–7.7)
Neutrophils Relative %: 95 % — ABNORMAL HIGH (ref 43–77)
Platelets: 321 10*3/uL (ref 150–400)
RBC: 4.76 MIL/uL (ref 3.87–5.11)
RDW: 12.8 % (ref 11.5–15.5)
WBC: 16.6 10*3/uL — ABNORMAL HIGH (ref 4.0–10.5)

## 2015-05-25 LAB — I-STAT TROPONIN, ED: Troponin i, poc: 0.05 ng/mL (ref 0.00–0.08)

## 2015-05-25 LAB — URINALYSIS, ROUTINE W REFLEX MICROSCOPIC
BILIRUBIN URINE: NEGATIVE
Glucose, UA: NEGATIVE mg/dL
Hgb urine dipstick: NEGATIVE
KETONES UR: NEGATIVE mg/dL
LEUKOCYTES UA: NEGATIVE
NITRITE: NEGATIVE
PH: 5 (ref 5.0–8.0)
PROTEIN: NEGATIVE mg/dL
Specific Gravity, Urine: 1.022 (ref 1.005–1.030)
Urobilinogen, UA: 0.2 mg/dL (ref 0.0–1.0)

## 2015-05-25 LAB — LIPASE, BLOOD: Lipase: 20 U/L — ABNORMAL LOW (ref 22–51)

## 2015-05-25 MED ORDER — SODIUM CHLORIDE 0.9 % IV BOLUS (SEPSIS)
1000.0000 mL | Freq: Once | INTRAVENOUS | Status: AC
  Administered 2015-05-25: 1000 mL via INTRAVENOUS

## 2015-05-25 MED ORDER — ONDANSETRON HCL 4 MG/2ML IJ SOLN
4.0000 mg | Freq: Once | INTRAMUSCULAR | Status: AC
Start: 2015-05-25 — End: 2015-05-25
  Administered 2015-05-25: 4 mg via INTRAVENOUS
  Filled 2015-05-25: qty 2

## 2015-05-25 MED ORDER — ONDANSETRON 4 MG PO TBDP: 4.0000 mg | ORAL_TABLET | Freq: Three times a day (TID) | ORAL | Status: DC | PRN

## 2015-05-25 NOTE — ED Provider Notes (Signed)
CSN: [id number]     Arrival date & time 05/25/15  0840 History   First MD Initiated Contact with Patient 05/25/15 [id number]     Chief Complaint  Patient presents with  . Emesis  . Nausea     (Consider location/radiation/quality/duration/timing/severity/associated sxs/prior Treatment) The history is provided by the patient and medical records.    This is a 74 year old female with past medical history significant for hypertension, anxiety, hyperlipidemia, history of breast cancer, presenting to the ED for generalized weakness. Patient states yesterday she was feeling unwell and " just not like herself".  She states she went to bed early and awoke around 0230 with nausea and vomiting.  States emesis was clear. She vomited approximately 5 times. States she was vomiting she became diaphoretic and clammy. On EMS arrival patient was still dry heaving. She is given Zofran and route with resolution of symptoms. She states now she feels okay, she is just tired. She denies any chest pain or shortness of breath. No abdominal pain. Bowel movements have been normal. Prior abdominal surgeries include cholecystectomy. No fever or chills. No known sick contacts or travel. No recent antibiotics use.  Past Medical History  Diagnosis Date  . Hypertension   . Anxiety   . Hypercholesterolemia   . Cancer   . Breast cancer    Past Surgical History  Procedure Laterality Date  . Cholecystectomy, laparoscopic  1990   Family History  Problem Relation Age of Onset  . Cancer Brother    History  Substance Use Topics  . Smoking status: Never Smoker   . Smokeless tobacco: Never Used  . Alcohol Use: 1.2 oz/week    2 Shots of liquor per week   OB History    No data available     Review of Systems  Gastrointestinal: Positive for nausea and vomiting.  Neurological: Positive for weakness.  All other systems reviewed and are negative.     Allergies  Codeine and Sulfa antibiotics  Home Medications   Prior  to Admission medications   Medication Sig Start Date End Date Taking? Authorizing Provider  ALPRAZolam Duanne Moron) 0.5 MG tablet Take 0.5 mg by mouth at bedtime as needed. 1/2-1  tablet BID as need for anxiety/sleep    Historical Provider, MD  diltiazem (TIAZAC) 300 MG 24 hr capsule Take 300 mg by mouth daily.    Historical Provider, MD  HYDROcodone-acetaminophen (NORCO) 5-325 MG per tablet Take 1 tablet by mouth every 8 (eight) hours as needed for pain. 10/10/13   Heather Elnora Morrison, PA-C  lisinopril (PRINIVIL,ZESTRIL) 10 MG tablet Take 10 mg by mouth daily. 1/2 tablet PO q AM    Historical Provider, MD  naproxen (NAPROSYN) 500 MG tablet Take 1 tablet (500 mg total) by mouth 2 (two) times daily with a meal. 10/10/13   Gay Filler Copland, MD  pravastatin (PRAVACHOL) 40 MG tablet Take 40 mg by mouth daily.    Historical Provider, MD  promethazine (PHENERGAN) 12.5 MG tablet Take 1 tablet (12.5 mg total) by mouth every 8 (eight) hours as needed for nausea. 10/10/13   Heather Elnora Morrison, PA-C  spironolactone (ALDACTONE) 25 MG tablet Take 25 mg by mouth daily.    Historical Provider, MD  tamoxifen (NOLVADEX) 20 MG tablet Take 1 tablet (20 mg total) by mouth daily. 02/24/15   Nicholas Lose, MD   BP 117/69 mmHg  Pulse 75  Temp(Src) 97.6 F (36.4 C) (Oral)  Resp 12  SpO2 96%   Physical Exam  Constitutional: She  is oriented to person, place, and time. She appears well-developed and well-nourished. No distress.  Elderly  HENT:  Head: Normocephalic and atraumatic.  Mouth/Throat: Oropharynx is clear and moist.  Dry mucous membranes  Eyes: Conjunctivae and EOM are normal. Pupils are equal, round, and reactive to light.  Neck: Normal range of motion. Neck supple.  Cardiovascular: Normal rate, regular rhythm and normal heart sounds.   Pulmonary/Chest: Effort normal and breath sounds normal. No respiratory distress. She has no wheezes.  Abdominal: Soft. Bowel sounds are normal. There is no tenderness. There is no  guarding.  Musculoskeletal: Normal range of motion.  Neurological: She is alert and oriented to person, place, and time.  Skin: Skin is warm and dry. She is not diaphoretic.  Psychiatric: She has a normal mood and affect.  Nursing note and vitals reviewed.   ED Course  Procedures (including critical care time) Labs Review Labs Reviewed  CBC WITH DIFFERENTIAL/PLATELET - Abnormal; Notable for the following:    WBC 16.6 (*)    Neutrophils Relative % 95 (*)    Neutro Abs 15.8 (*)    Lymphocytes Relative 3 (*)    Lymphs Abs 0.5 (*)    Monocytes Relative 2 (*)    All other components within normal limits  COMPREHENSIVE METABOLIC PANEL - Abnormal; Notable for the following:    Chloride 99 (*)    Glucose, Bld 145 (*)    GFR calc non Af Amer 58 (*)    All other components within normal limits  LIPASE, BLOOD - Abnormal; Notable for the following:    Lipase 20 (*)    All other components within normal limits  URINALYSIS, ROUTINE W REFLEX MICROSCOPIC (NOT AT [facility]) - Abnormal; Notable for the following:    Color, Urine AMBER (*)    APPearance HAZY (*)    All other components within normal limits  I-STAT TROPOININ, ED    Imaging Review Dg Chest 2 View  05/25/2015   CLINICAL DATA:  Diaphoresis with nausea and vomiting.  Hypertension.  EXAM: CHEST  2 VIEW  COMPARISON:  December 21, 2010  FINDINGS: There is no edema or consolidation. The heart size and pulmonary vascularity are normal. No adenopathy. Aorta is borderline prominent with atherosclerotic change, stable. No bone lesions.  IMPRESSION: No edema or consolidation. Slight aortic prominence is stable and probably reflects chronic hypertensive change.   Electronically Signed   By: Lowella Grip III M.D.   On: 05/25/2015 10:17     EKG Interpretation   Date/Time:  Thursday May 25 2015 08:45:59 EDT Ventricular Rate:  76 PR Interval:  244 QRS Duration: 91 QT Interval:  432 QTC Calculation: 486 R Axis:   -6 Text Interpretation:   Sinus rhythm Prolonged PR interval Low voltage,  precordial leads Borderline prolonged QT interval Confirmed by Alvino Chapel   MD, Ovid Curd [phone]) on 05/25/2015 9:54:10 AM      MDM   Final diagnoses:  Weakness  Nausea and vomiting, vomiting of unspecified type   74 year old female here with generalized weakness, nausea, and vomiting.  Patient afebrile, nontoxic. He complained of abdominal pain, exam is benign. She does have mildly dry mucous membranes. EKG sinus rhythm without acute ischemic changes.  Lab work with leukocytosis of 16,000, nonspecific. Chest x-ray is clear. Patient was treated with IV fluids and Zofran with improvement of her symptoms. Her abdominal exam remains benign. She remains without any complaint of chest pain, shortness of breath, abdominal pain, or dizziness.  She has had no active  vomiting, was able to tolerate PO fluids without difficulty.  Patient will be discharged home with supportive care. She is instructed to follow-up closely with her primary care physician. Encouraged fluids, gentle diet and progress back to normal as tolerated.  Discussed plan with patient, he/she acknowledged understanding and agreed with plan of care.  Return precautions given for new or worsening symptoms.  Case discussed with attending physician, Dr. Alvino Chapel, who evaluated patient and agrees with assessment and plan of care.  Larene Pickett, PA-C 05/25/15 1518  Davonna Belling, MD 05/27/15 [phone]

## 2015-05-25 NOTE — ED Notes (Signed)
Pt drinking ginger ale  

## 2015-05-25 NOTE — ED Notes (Signed)
Patient transported to X-ray 

## 2015-05-25 NOTE — Discharge Instructions (Signed)
Take the prescribed medication as directed as needed for nausea.  Make sure to drink plenty of fluids to stay hydrated.  Start with bland diet, progress back to normal as tolerated. Follow-up with your primary care physician. Return to the ED for new or worsening symptoms-- abdominal pain, uncontrolled nausea or vomiting, high fever, worsening weakness, etc.

## 2015-05-25 NOTE — ED Notes (Signed)
Pt took sip of sprite and felt nauseated.

## 2015-05-25 NOTE — ED Notes (Addendum)
Per EMS - pt began having nausea/vomiting, generalized weakness, and was diaphoretic starting around 0230am this morning. Pt reports not feeling like herself yesterday morning. Pt reports clear emesis, was dry heaving with EMS. Hx breast cancer, SVT. NSR on monitor. BP 140/100 manual, 72bpm, CBG 131, RR 16-18, 2L for comfort. IV 22G left hand. Zofran for nausea.

## 2015-08-23 DIAGNOSIS — M545 Low back pain: Secondary | ICD-10-CM | POA: Diagnosis not present

## 2015-08-23 DIAGNOSIS — Z0000 Encounter for general adult medical examination without abnormal findings: Secondary | ICD-10-CM | POA: Diagnosis not present

## 2015-08-23 DIAGNOSIS — R350 Frequency of micturition: Secondary | ICD-10-CM | POA: Diagnosis not present

## 2015-08-23 DIAGNOSIS — I10 Essential (primary) hypertension: Secondary | ICD-10-CM | POA: Diagnosis not present

## 2015-08-23 DIAGNOSIS — M8580 Other specified disorders of bone density and structure, unspecified site: Secondary | ICD-10-CM | POA: Diagnosis not present

## 2015-08-23 DIAGNOSIS — F419 Anxiety disorder, unspecified: Secondary | ICD-10-CM | POA: Diagnosis not present

## 2015-08-23 DIAGNOSIS — E780 Pure hypercholesterolemia: Secondary | ICD-10-CM | POA: Diagnosis not present

## 2015-08-23 DIAGNOSIS — Z23 Encounter for immunization: Secondary | ICD-10-CM | POA: Diagnosis not present

## 2015-08-23 DIAGNOSIS — F324 Major depressive disorder, single episode, in partial remission: Secondary | ICD-10-CM | POA: Diagnosis not present

## 2015-08-23 DIAGNOSIS — R609 Edema, unspecified: Secondary | ICD-10-CM | POA: Diagnosis not present

## 2015-09-14 ENCOUNTER — Telehealth: Payer: Self-pay

## 2015-09-14 NOTE — Telephone Encounter (Signed)
Per Dr. Geralyn Flash note, pt does not need to take tamoxifen after June of 2016.  Pt voiced understanding.

## 2015-09-28 NOTE — Telephone Encounter (Signed)
error 

## 2016-02-20 DIAGNOSIS — E7800 Pure hypercholesterolemia, unspecified: Secondary | ICD-10-CM | POA: Diagnosis not present

## 2016-02-20 DIAGNOSIS — R609 Edema, unspecified: Secondary | ICD-10-CM | POA: Diagnosis not present

## 2016-02-20 DIAGNOSIS — M545 Low back pain: Secondary | ICD-10-CM | POA: Diagnosis not present

## 2016-02-20 DIAGNOSIS — F324 Major depressive disorder, single episode, in partial remission: Secondary | ICD-10-CM | POA: Diagnosis not present

## 2016-02-20 DIAGNOSIS — F419 Anxiety disorder, unspecified: Secondary | ICD-10-CM | POA: Diagnosis not present

## 2016-02-20 DIAGNOSIS — I10 Essential (primary) hypertension: Secondary | ICD-10-CM | POA: Diagnosis not present

## 2016-02-20 DIAGNOSIS — M8580 Other specified disorders of bone density and structure, unspecified site: Secondary | ICD-10-CM | POA: Diagnosis not present

## 2016-02-27 ENCOUNTER — Ambulatory Visit (HOSPITAL_BASED_OUTPATIENT_CLINIC_OR_DEPARTMENT_OTHER): Payer: Medicare Other | Admitting: Hematology and Oncology

## 2016-02-27 ENCOUNTER — Encounter: Payer: Self-pay | Admitting: Hematology and Oncology

## 2016-02-27 VITALS — BP 136/75 | HR 85 | Temp 98.1°F | Resp 18 | Wt 187.0 lb

## 2016-02-27 DIAGNOSIS — Z9223 Personal history of estrogen therapy: Secondary | ICD-10-CM

## 2016-02-27 DIAGNOSIS — Z86000 Personal history of in-situ neoplasm of breast: Secondary | ICD-10-CM

## 2016-02-27 NOTE — Progress Notes (Signed)
Patient Care Team: Maury Dus, MD as PCP - General (Family Medicine) Amada Kingfisher, MD as Consulting Physician (Hematology and Oncology)  DIAGNOSIS:  DCIS left breast  CHIEF COMPLIANT:  Follow-up of DCIS having completed tamoxifen therapy  INTERVAL HISTORY: Erin Gillespie is a  75 year old with above-mentioned history of DCIS left breast was here for routine follow-up. She completed tamoxifen 5 years as of June 2016. She denies any lumps or nodules. She's been feeling quite well.  REVIEW OF SYSTEMS:   Constitutional: Denies fevers, chills or abnormal weight loss Eyes: Denies blurriness of vision Ears, nose, mouth, throat, and face: Denies mucositis or sore throat Respiratory: Denies cough, dyspnea or wheezes Cardiovascular: Denies palpitation, chest discomfort Gastrointestinal:  Denies nausea, heartburn or change in bowel habits Skin: Denies abnormal skin rashes Lymphatics: Denies new lymphadenopathy or easy bruising Neurological:Denies numbness, tingling or new weaknesses Behavioral/Psych: Mood is stable, no new changes  Extremities: No lower extremity edema Breast:  denies any pain or lumps or nodules in either breasts All other systems were reviewed with the patient and are negative.  I have reviewed the past medical history, past surgical history, social history and family history with the patient and they are unchanged from previous note.  ALLERGIES:  is allergic to codeine and sulfa antibiotics.  MEDICATIONS:  Current Outpatient Prescriptions  Medication Sig Dispense Refill  . ALPRAZolam (XANAX) 0.5 MG tablet Take 0.5 mg by mouth at bedtime as needed. 1/2-1  tablet BID as need for anxiety/sleep    . diltiazem (TIAZAC) 300 MG 24 hr capsule Take 300 mg by mouth daily.    Marland Kitchen HYDROcodone-acetaminophen (NORCO) 5-325 MG per tablet Take 1 tablet by mouth every 8 (eight) hours as needed for pain. 20 tablet 0  . lisinopril (PRINIVIL,ZESTRIL) 10 MG tablet Take 5 mg by mouth daily. 1/2  tablet PO q AM    . naproxen (NAPROSYN) 500 MG tablet Take 1 tablet (500 mg total) by mouth 2 (two) times daily with a meal. (Patient taking differently: Take 500 mg by mouth 2 (two) times daily as needed for mild pain or moderate pain. ) 30 tablet 1  . ondansetron (ZOFRAN ODT) 4 MG disintegrating tablet Take 1 tablet (4 mg total) by mouth every 8 (eight) hours as needed for nausea. 12 tablet 0  . pravastatin (PRAVACHOL) 40 MG tablet Take 40 mg by mouth daily.    . promethazine (PHENERGAN) 12.5 MG tablet Take 1 tablet (12.5 mg total) by mouth every 8 (eight) hours as needed for nausea. (Patient not taking: Reported on 05/25/2015) 20 tablet 0  . spironolactone (ALDACTONE) 25 MG tablet Take 25 mg by mouth daily.    . tamoxifen (NOLVADEX) 20 MG tablet Take 1 tablet (20 mg total) by mouth daily. 90 tablet 3   No current facility-administered medications for this visit.    PHYSICAL EXAMINATION: ECOG PERFORMANCE STATUS: 0 - Asymptomatic  Filed Vitals:   02/27/16 1011  BP: 136/75  Pulse: 85  Temp: 98.1 F (36.7 C)  Resp: 18   Filed Weights   02/27/16 1011  Weight: 187 lb (84.823 kg)    GENERAL:alert, no distress and comfortable SKIN: skin color, texture, turgor are normal, no rashes or significant lesions EYES: normal, Conjunctiva are pink and non-injected, sclera clear OROPHARYNX:no exudate, no erythema and lips, buccal mucosa, and tongue normal  NECK: supple, thyroid normal size, non-tender, without nodularity LYMPH:  no palpable lymphadenopathy in the cervical, axillary or inguinal LUNGS: clear to auscultation and percussion with normal  breathing effort HEART: regular rate & rhythm and no murmurs and no lower extremity edema ABDOMEN:abdomen soft, non-tender and normal bowel sounds MUSCULOSKELETAL:no cyanosis of digits and no clubbing  NEURO: alert & oriented x 3 with fluent speech, no focal motor/sensory deficits EXTREMITIES: No lower extremity edema BREAST: No palpable masses or  nodules in either right or left breasts. No palpable axillary supraclavicular or infraclavicular adenopathy no breast tenderness or nipple discharge. (exam performed in the presence of a chaperone)  LABORATORY DATA:  I have reviewed the data as listed   Chemistry      Component Value Date/Time   NA 136 05/25/2015 0907   NA 140 02/24/2015 0957   K 3.9 05/25/2015 0907   K 4.1 02/24/2015 0957   CL 99* 05/25/2015 0907   CL 104 02/25/2013 1000   CO2 25 05/25/2015 0907   CO2 24 02/24/2015 0957   BUN 16 05/25/2015 0907   BUN 13.7 02/24/2015 0957   CREATININE 0.96 05/25/2015 0907   CREATININE 0.9 02/24/2015 0957      Component Value Date/Time   CALCIUM 9.0 05/25/2015 0907   CALCIUM 9.9 02/24/2015 0957   ALKPHOS 54 05/25/2015 0907   ALKPHOS 53 02/24/2015 0957   AST 19 05/25/2015 0907   AST 13 02/24/2015 0957   ALT 14 05/25/2015 0907   ALT 12 02/24/2015 0957   BILITOT 0.8 05/25/2015 0907   BILITOT 0.44 02/24/2015 0957       Lab Results  Component Value Date   WBC 16.6* 05/25/2015   HGB 14.9 05/25/2015   HCT 42.9 05/25/2015   MCV 90.1 05/25/2015   PLT 321 05/25/2015   NEUTROABS 15.8* 05/25/2015     ASSESSMENT & PLAN:  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  Breast cancer surveillance: 1. Breast exam 02/27/2016 is normal 2. Mammogram 03/02/2015 at [facility] is normal. Patient will be scheduled for another mammogram this month.  Patient wishes to follow-up with her primary care physician for her breast cancer surveillance. She will be seen by [place] on an as-needed basis.    No orders of the defined types were placed in this encounter.   The patient has a good understanding of the overall plan. she  agrees with it. she will call with any problems that may develop before the next visit here.   Rulon Eisenmenger, MD 02/27/2016

## 2016-02-27 NOTE — Assessment & Plan Note (Signed)
Left breast DCIS high-grade with calcifications 1.5 cm status post lumpectomy 01/01/2010, ER 99%, PR 100% status post radiation therapy on 03/05/2010 to 04/30/2010, tamoxifen 20 mg daily from June 2011 - June 2016  Tamoxifen toxicities: No major toxicities to tamoxifen.  Breast cancer surveillance: 1. Breast exam 02/27/2016 is normal 2. Mammogram 03/02/2015 at [facility] is normal. Patient will be scheduled for another mammogram this month.  Return to clinic in 1 year survivorship clinic

## 2016-08-28 DIAGNOSIS — M25562 Pain in left knee: Secondary | ICD-10-CM | POA: Diagnosis not present

## 2016-09-26 DIAGNOSIS — M1712 Unilateral primary osteoarthritis, left knee: Secondary | ICD-10-CM | POA: Diagnosis not present

## 2016-10-02 DIAGNOSIS — M545 Low back pain: Secondary | ICD-10-CM | POA: Diagnosis not present

## 2016-10-02 DIAGNOSIS — Z853 Personal history of malignant neoplasm of breast: Secondary | ICD-10-CM | POA: Diagnosis not present

## 2016-10-02 DIAGNOSIS — R609 Edema, unspecified: Secondary | ICD-10-CM | POA: Diagnosis not present

## 2016-10-02 DIAGNOSIS — Z0000 Encounter for general adult medical examination without abnormal findings: Secondary | ICD-10-CM | POA: Diagnosis not present

## 2016-10-02 DIAGNOSIS — E7800 Pure hypercholesterolemia, unspecified: Secondary | ICD-10-CM | POA: Diagnosis not present

## 2016-10-02 DIAGNOSIS — F324 Major depressive disorder, single episode, in partial remission: Secondary | ICD-10-CM | POA: Diagnosis not present

## 2016-10-02 DIAGNOSIS — Z23 Encounter for immunization: Secondary | ICD-10-CM | POA: Diagnosis not present

## 2016-10-02 DIAGNOSIS — F419 Anxiety disorder, unspecified: Secondary | ICD-10-CM | POA: Diagnosis not present

## 2016-10-02 DIAGNOSIS — I10 Essential (primary) hypertension: Secondary | ICD-10-CM | POA: Diagnosis not present

## 2016-10-02 DIAGNOSIS — M8580 Other specified disorders of bone density and structure, unspecified site: Secondary | ICD-10-CM | POA: Diagnosis not present

## 2016-10-02 DIAGNOSIS — M25562 Pain in left knee: Secondary | ICD-10-CM | POA: Diagnosis not present

## 2016-10-14 DIAGNOSIS — D72829 Elevated white blood cell count, unspecified: Secondary | ICD-10-CM | POA: Diagnosis not present

## 2016-11-07 DIAGNOSIS — Z853 Personal history of malignant neoplasm of breast: Secondary | ICD-10-CM | POA: Diagnosis not present

## 2017-04-02 DIAGNOSIS — M25562 Pain in left knee: Secondary | ICD-10-CM | POA: Diagnosis not present

## 2017-04-02 DIAGNOSIS — I10 Essential (primary) hypertension: Secondary | ICD-10-CM | POA: Diagnosis not present

## 2017-04-02 DIAGNOSIS — M545 Low back pain: Secondary | ICD-10-CM | POA: Diagnosis not present

## 2017-04-02 DIAGNOSIS — R609 Edema, unspecified: Secondary | ICD-10-CM | POA: Diagnosis not present

## 2017-04-02 DIAGNOSIS — M8580 Other specified disorders of bone density and structure, unspecified site: Secondary | ICD-10-CM | POA: Diagnosis not present

## 2017-04-02 DIAGNOSIS — F324 Major depressive disorder, single episode, in partial remission: Secondary | ICD-10-CM | POA: Diagnosis not present

## 2017-04-02 DIAGNOSIS — Z1211 Encounter for screening for malignant neoplasm of colon: Secondary | ICD-10-CM | POA: Diagnosis not present

## 2017-04-02 DIAGNOSIS — F419 Anxiety disorder, unspecified: Secondary | ICD-10-CM | POA: Diagnosis not present

## 2017-04-02 DIAGNOSIS — Z853 Personal history of malignant neoplasm of breast: Secondary | ICD-10-CM | POA: Diagnosis not present

## 2017-04-02 DIAGNOSIS — E7800 Pure hypercholesterolemia, unspecified: Secondary | ICD-10-CM | POA: Diagnosis not present

## 2017-05-22 ENCOUNTER — Encounter: Payer: Self-pay | Admitting: Family Medicine

## 2017-05-23 DIAGNOSIS — M1712 Unilateral primary osteoarthritis, left knee: Secondary | ICD-10-CM | POA: Diagnosis not present

## 2017-08-06 DIAGNOSIS — N6320 Unspecified lump in the left breast, unspecified quadrant: Secondary | ICD-10-CM | POA: Diagnosis not present

## 2017-08-07 DIAGNOSIS — N6322 Unspecified lump in the left breast, upper inner quadrant: Secondary | ICD-10-CM | POA: Diagnosis not present

## 2017-08-07 DIAGNOSIS — Z853 Personal history of malignant neoplasm of breast: Secondary | ICD-10-CM | POA: Diagnosis not present

## 2017-08-07 DIAGNOSIS — R922 Inconclusive mammogram: Secondary | ICD-10-CM | POA: Diagnosis not present

## 2017-10-07 DIAGNOSIS — F324 Major depressive disorder, single episode, in partial remission: Secondary | ICD-10-CM | POA: Diagnosis not present

## 2017-10-07 DIAGNOSIS — M545 Low back pain: Secondary | ICD-10-CM | POA: Diagnosis not present

## 2017-10-07 DIAGNOSIS — F419 Anxiety disorder, unspecified: Secondary | ICD-10-CM | POA: Diagnosis not present

## 2017-10-07 DIAGNOSIS — Z23 Encounter for immunization: Secondary | ICD-10-CM | POA: Diagnosis not present

## 2017-10-07 DIAGNOSIS — Z0000 Encounter for general adult medical examination without abnormal findings: Secondary | ICD-10-CM | POA: Diagnosis not present

## 2017-10-07 DIAGNOSIS — M8580 Other specified disorders of bone density and structure, unspecified site: Secondary | ICD-10-CM | POA: Diagnosis not present

## 2017-10-07 DIAGNOSIS — E7800 Pure hypercholesterolemia, unspecified: Secondary | ICD-10-CM | POA: Diagnosis not present

## 2017-10-07 DIAGNOSIS — Z6831 Body mass index (BMI) 31.0-31.9, adult: Secondary | ICD-10-CM | POA: Diagnosis not present

## 2017-10-07 DIAGNOSIS — R609 Edema, unspecified: Secondary | ICD-10-CM | POA: Diagnosis not present

## 2017-10-07 DIAGNOSIS — M25562 Pain in left knee: Secondary | ICD-10-CM | POA: Diagnosis not present

## 2017-10-07 DIAGNOSIS — Z1389 Encounter for screening for other disorder: Secondary | ICD-10-CM | POA: Diagnosis not present

## 2017-10-07 DIAGNOSIS — I10 Essential (primary) hypertension: Secondary | ICD-10-CM | POA: Diagnosis not present

## 2018-03-18 ENCOUNTER — Other Ambulatory Visit: Payer: Self-pay

## 2018-03-18 ENCOUNTER — Ambulatory Visit (HOSPITAL_COMMUNITY)
Admission: EM | Admit: 2018-03-18 | Discharge: 2018-03-18 | Disposition: A | Discharge status: Home / Self Care | Payer: Medicare Other | Attending: Family Medicine | Admitting: Family Medicine

## 2018-03-18 ENCOUNTER — Encounter (HOSPITAL_COMMUNITY): Payer: Self-pay | Admitting: Emergency Medicine

## 2018-03-18 ENCOUNTER — Ambulatory Visit (INDEPENDENT_AMBULATORY_CARE_PROVIDER_SITE_OTHER): Payer: Medicare Other

## 2018-03-18 DIAGNOSIS — W2209XA Striking against other stationary object, initial encounter: Secondary | ICD-10-CM

## 2018-03-18 DIAGNOSIS — S99921A Unspecified injury of right foot, initial encounter: Secondary | ICD-10-CM | POA: Diagnosis not present

## 2018-03-18 DIAGNOSIS — S92501A Displaced unspecified fracture of right lesser toe(s), initial encounter for closed fracture: Secondary | ICD-10-CM | POA: Diagnosis not present

## 2018-03-18 DIAGNOSIS — S92511A Displaced fracture of proximal phalanx of right lesser toe(s), initial encounter for closed fracture: Secondary | ICD-10-CM

## 2018-03-18 DIAGNOSIS — S92411A Displaced fracture of proximal phalanx of right great toe, initial encounter for closed fracture: Secondary | ICD-10-CM | POA: Diagnosis not present

## 2018-03-18 MED ORDER — LIDOCAINE HCL 2 % IJ SOLN
INTRAMUSCULAR | Status: AC
  Filled 2018-03-18: qty 20

## 2018-03-18 NOTE — ED Triage Notes (Signed)
Pt hit her right pinky toe on a chair about an hour ago.  Pt's toe is misaligned and she has bruising at the base of the toe.

## 2018-03-18 NOTE — Discharge Instructions (Signed)
Please continue to wear your cast shoe until your toe pain improves.

## 2018-03-18 NOTE — ED Provider Notes (Signed)
[place]   [id number] 03/18/18 Arrival Time: 7353  ASSESSMENT & PLAN:  1. Right foot injury, initial encounter   2. Closed fracture of phalanx of right fifth toe, initial encounter    Imaging: Dg Foot Complete Right  Result Date: 03/18/2018 CLINICAL DATA:  Per pt: jammed the right pinky toe into the chair leg about an hour ago. Right pinky toe appears to be not aligned with the other toes. No prior injury to the right foot/pinky toe. Patient is not a diabetic. EXAM: RIGHT FOOT COMPLETE - 3+ VIEW COMPARISON:  None. FINDINGS: There is a transverse fracture of the distal aspect of the fifth toe proximal phalanx. The distal fracture component has displaced laterally by 4 mm. No fracture comminution. Fractures also angulated laterally approximately 35 degrees. No other fractures.  The joints are normally spaced and aligned. Small plantar calcaneal spur. Soft tissues are unremarkable. IMPRESSION: 1. Mildly displaced fracture of the distal aspect of the proximal phalanx of the right fifth toe. No dislocation. Electronically Signed   By: Lajean Manes M.D.   On: 03/18/2018 16:07   Skin over R 5th toe cleaned with alcohol. Approx 2cc lidocaine without epinephrine injected for local anesthesia. R 5th toe re-aligned with gentle traction. She tolerated well. To wear cast shoe until better.  Prefers OTC analgesics as needed. Written information on fracture and splint care given. To arrange orthopaedic follow up within one week. May f/u here as needed.  Reviewed expectations re: course of current medical issues. Questions answered. Outlined signs and symptoms indicating need for more acute intervention. Patient verbalized understanding. After Visit Summary given.  SUBJECTIVE: History from: patient. MARYALYCE SANJUAN is a 77 y.o. female who reports persistent pain of her right 5th toe with swelling. Onset gradual beginning 1 hour ago. Injury/trama: yes, reports "jamming toe" against piece of  furniture; barefooted at the time. Discomfort described as throbbing without radiation. Extremity sensation changes or weakness: none. Self treatment: has not tried OTCs for relief of pain. Able to bear weight with much discomfort. ROS: As per HPI.   OBJECTIVE:  Vitals:   03/18/18 1533  BP: (!) 162/92  Pulse: 74  Temp: 97.9 F (36.6 C)  TempSrc: Oral  SpO2: 95%    General appearance: alert; no distress Extremities: no cyanosis or edema; symmetrical with no gross deformities; tenderness over her right 5th toe with moderate swelling and mild bruising; 5th toe is grossly misaligned and very tender to touch CV: normal extremity capillary refill Skin: warm and dry Neurologic: normal gait; normal symmetric reflexes in all extremities; normal sensation in all extremities Psychological: alert and cooperative; normal mood and affect  Allergies  Allergen Reactions  . Codeine Nausea And Vomiting and Rash  . Sulfa Antibiotics Rash    Past Medical History:  Diagnosis Date  . Anxiety   . Breast cancer ([place])   . Cancer ([place])   . Hypercholesterolemia   . Hypertension    Social History   Socioeconomic History  . Marital status: Widowed    Spouse name: Not on file  . Number of children: Not on file  . Years of education: Not on file  . Highest education level: Not on file  Occupational History  . Not on file  Social Needs  . Financial resource strain: Not on file  . Food insecurity:    Worry: Not on file    Inability: Not on file  . Transportation needs:    Medical: Not on file  Non-medical: Not on file  Tobacco Use  . Smoking status: Never Smoker  . Smokeless tobacco: Never Used  Substance and Sexual Activity  . Alcohol use: Yes    Alcohol/week: 1.2 oz    Types: 2 Shots of liquor per week  . Drug use: No  . Sexual activity: Not Currently  Lifestyle  . Physical activity:    Days per week: Not on file    Minutes per session: Not on file  . Stress: Not on file    Relationships  . Social connections:    Talks on phone: Not on file    Gets together: Not on file    Attends religious service: Not on file    Active member of club or organization: Not on file    Attends meetings of clubs or organizations: Not on file    Relationship status: Not on file  . Intimate partner violence:    Fear of current or ex partner: Not on file    Emotionally abused: Not on file    Physically abused: Not on file    Forced sexual activity: Not on file  Other Topics Concern  . Not on file  Social History Narrative  . Not on file   Family History  Problem Relation Age of Onset  . Cancer Brother    Past Surgical History:  Procedure Laterality Date  . CHOLECYSTECTOMY, LAPAROSCOPIC  1990      Vanessa Kick, MD 03/21/18 1134

## 2018-04-14 DIAGNOSIS — I10 Essential (primary) hypertension: Secondary | ICD-10-CM | POA: Diagnosis not present

## 2018-04-14 DIAGNOSIS — F324 Major depressive disorder, single episode, in partial remission: Secondary | ICD-10-CM | POA: Diagnosis not present

## 2018-04-14 DIAGNOSIS — M25562 Pain in left knee: Secondary | ICD-10-CM | POA: Diagnosis not present

## 2018-04-14 DIAGNOSIS — F419 Anxiety disorder, unspecified: Secondary | ICD-10-CM | POA: Diagnosis not present

## 2018-04-14 DIAGNOSIS — M8580 Other specified disorders of bone density and structure, unspecified site: Secondary | ICD-10-CM | POA: Diagnosis not present

## 2018-04-14 DIAGNOSIS — Z6830 Body mass index (BMI) 30.0-30.9, adult: Secondary | ICD-10-CM | POA: Diagnosis not present

## 2018-04-14 DIAGNOSIS — R609 Edema, unspecified: Secondary | ICD-10-CM | POA: Diagnosis not present

## 2018-04-14 DIAGNOSIS — E2839 Other primary ovarian failure: Secondary | ICD-10-CM | POA: Diagnosis not present

## 2018-04-14 DIAGNOSIS — M545 Low back pain: Secondary | ICD-10-CM | POA: Diagnosis not present

## 2018-04-14 DIAGNOSIS — Z853 Personal history of malignant neoplasm of breast: Secondary | ICD-10-CM | POA: Diagnosis not present

## 2018-04-14 DIAGNOSIS — E7800 Pure hypercholesterolemia, unspecified: Secondary | ICD-10-CM | POA: Diagnosis not present

## 2018-04-14 DIAGNOSIS — R000 Tachycardia, unspecified: Secondary | ICD-10-CM | POA: Diagnosis not present

## 2018-05-01 IMAGING — DX DG FOOT COMPLETE 3+V*R*
3 series · 3 of 3 positions shown · non-contrast
Comparison: None.

CLINICAL DATA: Per pt: jammed the right pinky toe into the chair
leg about an hour ago. Right pinky toe appears to be not aligned
with the other toes. No prior injury to the right foot/pinky toe.

EXAM:
RIGHT FOOT COMPLETE - 3+ VIEW

[foot ap]
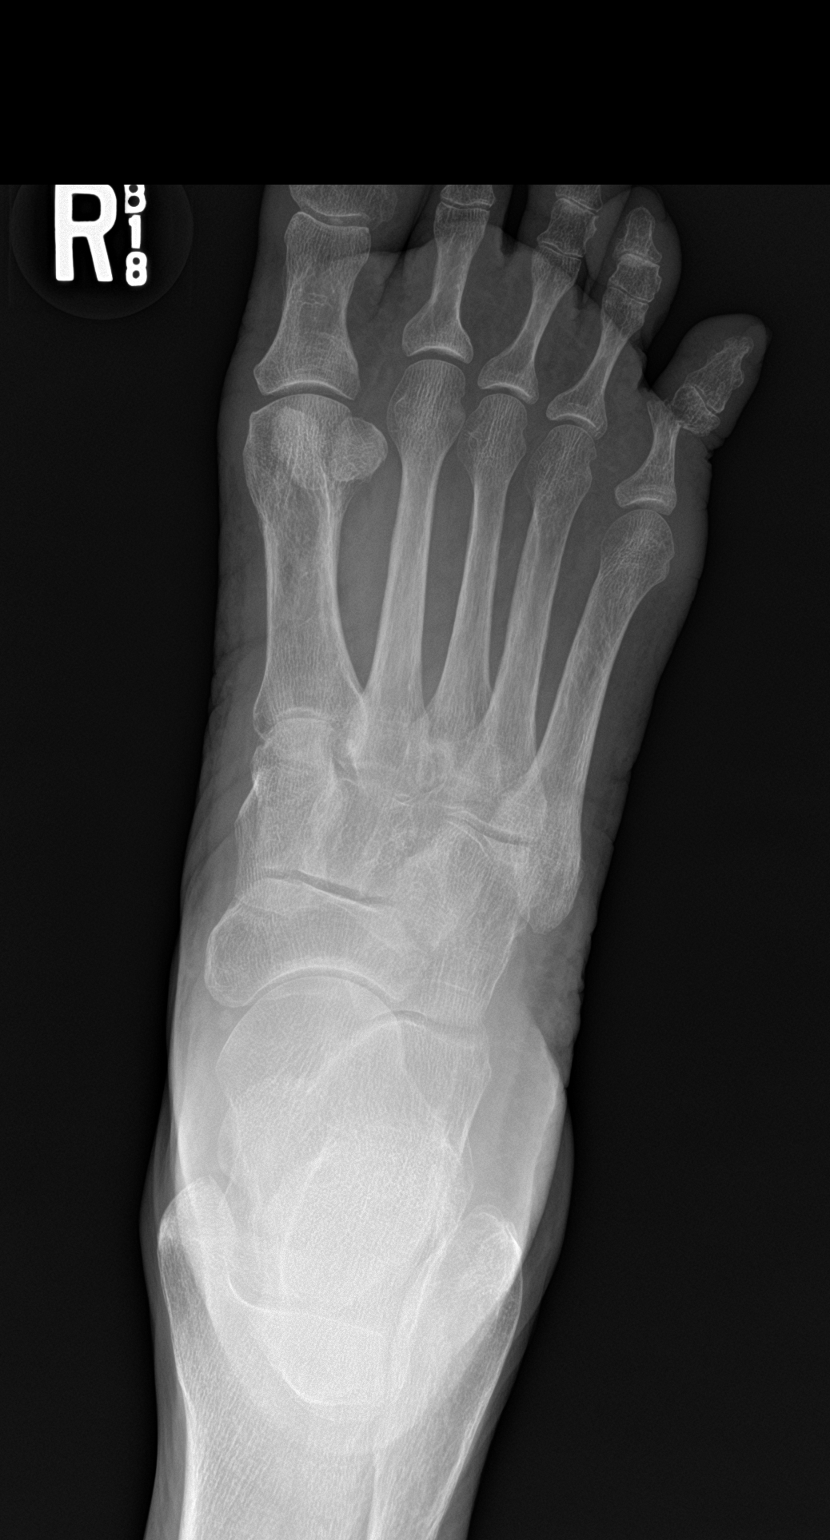

[foot obl]
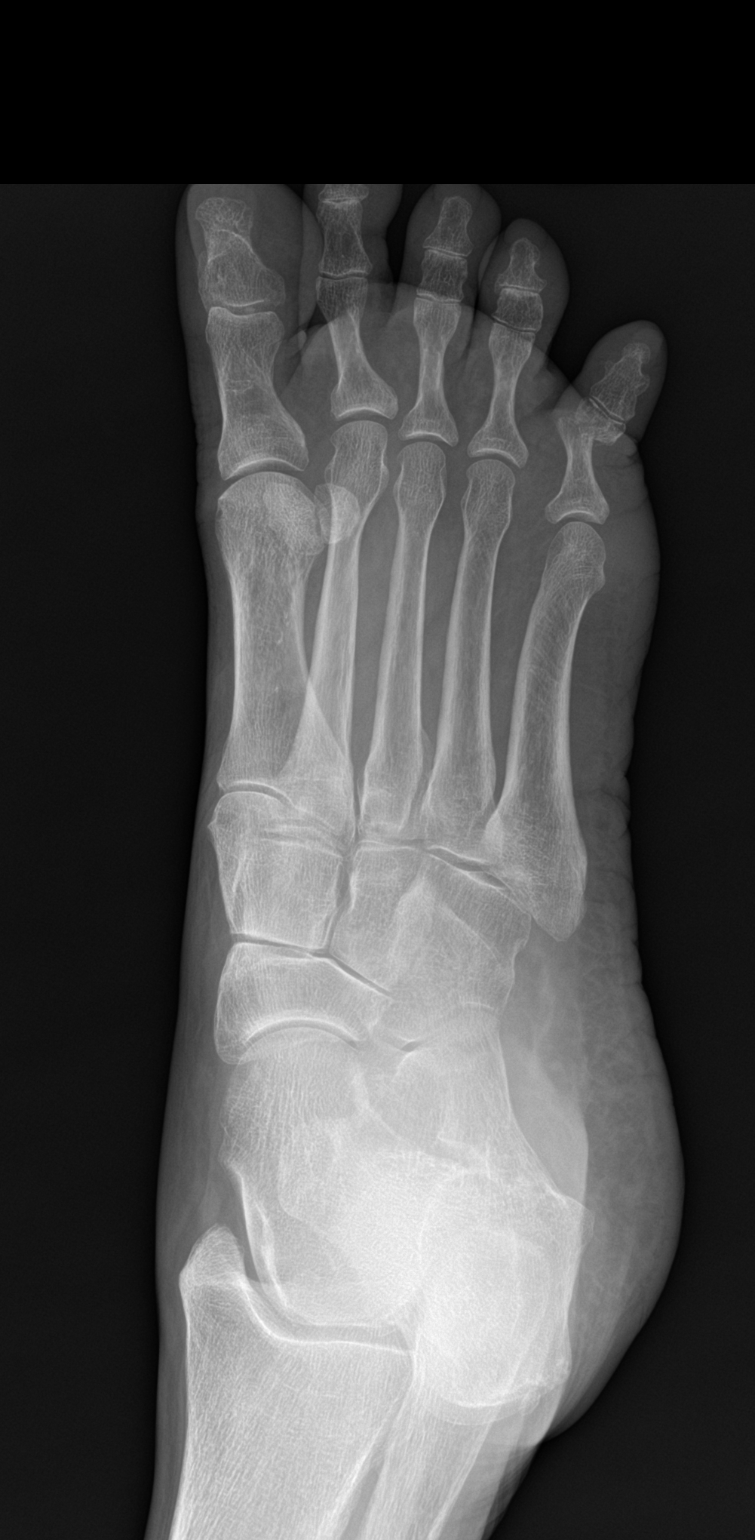

[foot lat]
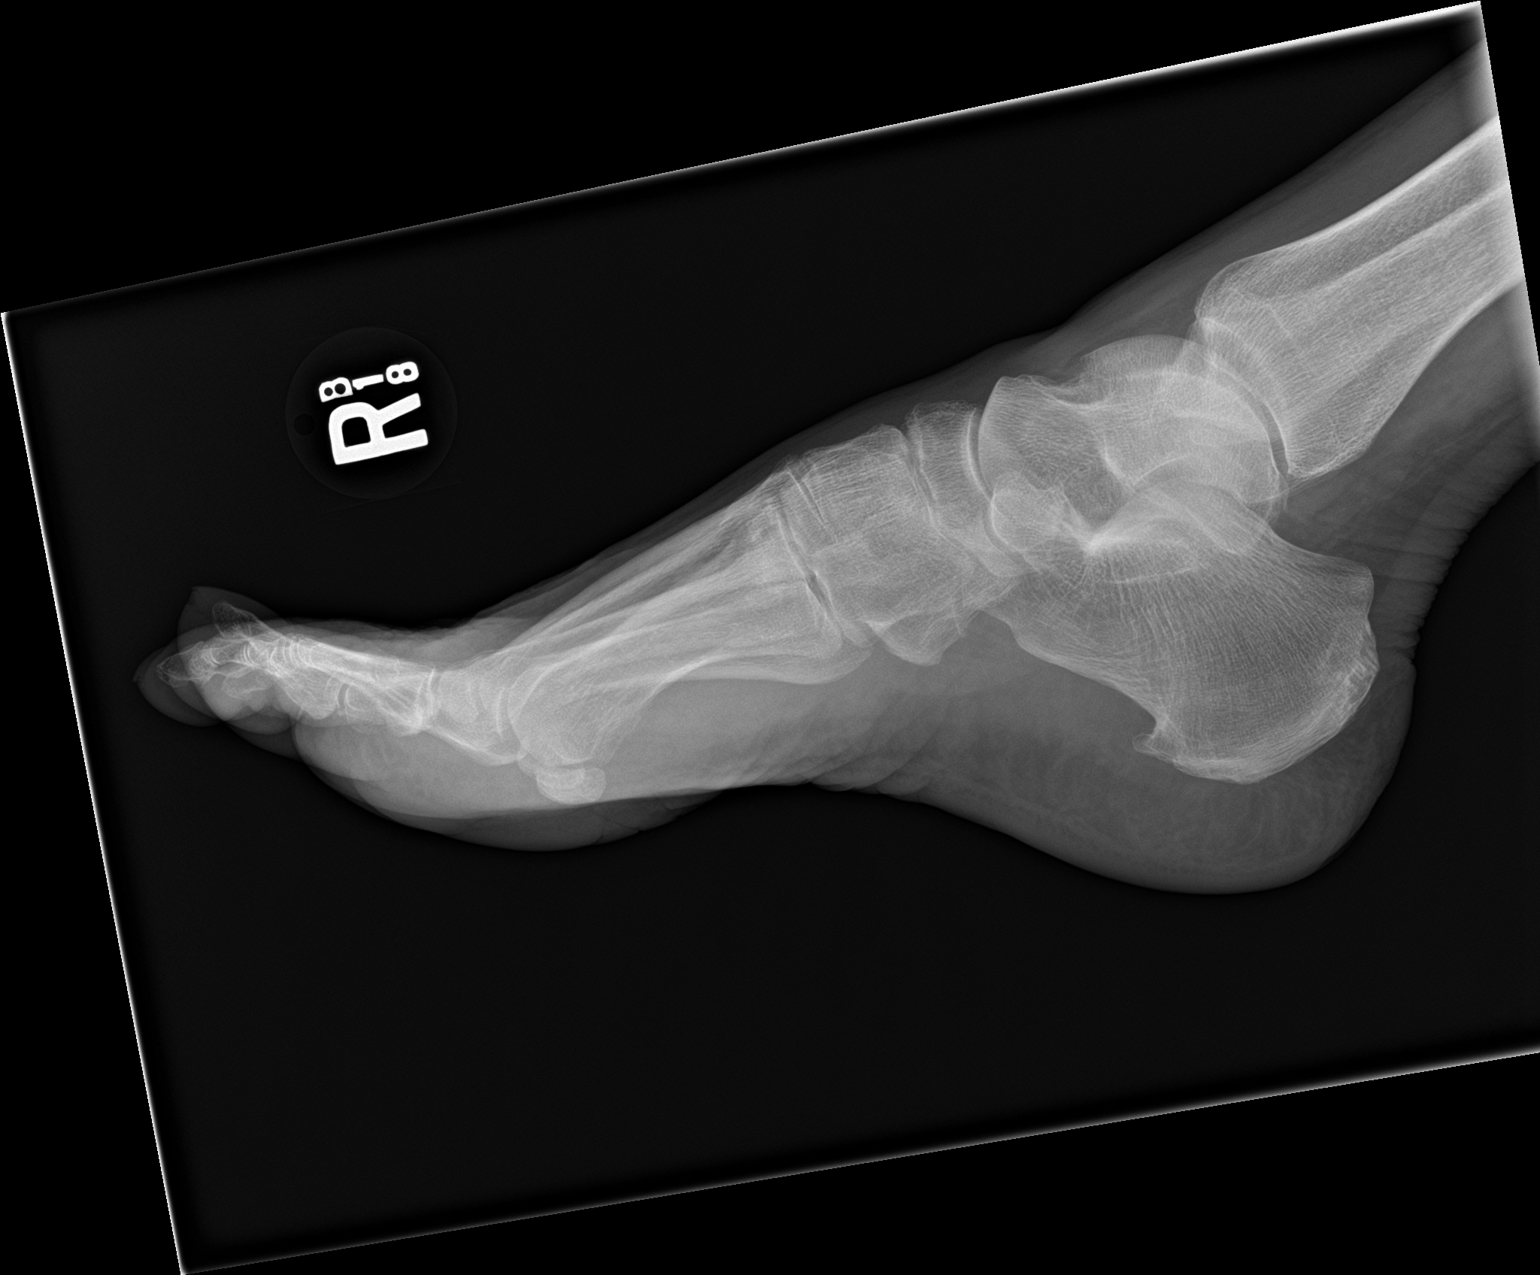

[3 of 3 positions shown; findings below may reference images not displayed]

FINDINGS: There is a transverse fracture of the distal aspect of the fifth toe
proximal phalanx. The distal fracture component has displaced
laterally by 4 mm. No fracture comminution. Fractures also angulated
laterally approximately 35 degrees.

No other fractures.  The joints are normally spaced and aligned.

Small plantar calcaneal spur.

Soft tissues are unremarkable.
IMPRESSION: 1. Mildly displaced fracture of the distal aspect of the proximal
phalanx of the right fifth toe. No dislocation.

## 2018-05-25 ENCOUNTER — Other Ambulatory Visit (HOSPITAL_COMMUNITY): Payer: Self-pay | Admitting: Family Medicine

## 2018-05-25 ENCOUNTER — Other Ambulatory Visit: Payer: Self-pay | Admitting: Family Medicine

## 2018-05-25 DIAGNOSIS — R000 Tachycardia, unspecified: Secondary | ICD-10-CM

## 2018-06-02 ENCOUNTER — Encounter (INDEPENDENT_AMBULATORY_CARE_PROVIDER_SITE_OTHER): Payer: Self-pay

## 2018-06-02 ENCOUNTER — Ambulatory Visit (INDEPENDENT_AMBULATORY_CARE_PROVIDER_SITE_OTHER): Payer: Medicare Other

## 2018-06-02 DIAGNOSIS — R000 Tachycardia, unspecified: Secondary | ICD-10-CM | POA: Diagnosis not present

## 2018-09-02 DIAGNOSIS — Z853 Personal history of malignant neoplasm of breast: Secondary | ICD-10-CM | POA: Diagnosis not present

## 2018-09-02 DIAGNOSIS — R922 Inconclusive mammogram: Secondary | ICD-10-CM | POA: Diagnosis not present

## 2018-09-02 DIAGNOSIS — N6323 Unspecified lump in the left breast, lower outer quadrant: Secondary | ICD-10-CM | POA: Diagnosis not present

## 2018-09-07 ENCOUNTER — Other Ambulatory Visit: Payer: Self-pay | Admitting: Radiology

## 2018-09-07 DIAGNOSIS — C50512 Malignant neoplasm of lower-outer quadrant of left female breast: Secondary | ICD-10-CM | POA: Diagnosis not present

## 2018-09-11 ENCOUNTER — Ambulatory Visit: Payer: Self-pay | Admitting: General Surgery

## 2018-09-11 DIAGNOSIS — C50512 Malignant neoplasm of lower-outer quadrant of left female breast: Secondary | ICD-10-CM | POA: Diagnosis not present

## 2018-09-11 DIAGNOSIS — Z170 Estrogen receptor positive status [ER+]: Secondary | ICD-10-CM | POA: Diagnosis not present

## 2018-09-15 ENCOUNTER — Encounter: Payer: Self-pay | Admitting: Licensed Clinical Social Worker

## 2018-09-17 ENCOUNTER — Telehealth: Payer: Self-pay | Admitting: Hematology and Oncology

## 2018-09-17 NOTE — Telephone Encounter (Signed)
Pt called to rs appt

## 2018-09-23 ENCOUNTER — Telehealth: Payer: Self-pay | Admitting: Hematology and Oncology

## 2018-09-23 ENCOUNTER — Inpatient Hospital Stay: Payer: Medicare Other | Attending: Hematology and Oncology | Admitting: Hematology and Oncology

## 2018-09-23 DIAGNOSIS — Z170 Estrogen receptor positive status [ER+]: Secondary | ICD-10-CM

## 2018-09-23 DIAGNOSIS — Z923 Personal history of irradiation: Secondary | ICD-10-CM | POA: Insufficient documentation

## 2018-09-23 DIAGNOSIS — Z9012 Acquired absence of left breast and nipple: Secondary | ICD-10-CM | POA: Diagnosis not present

## 2018-09-23 DIAGNOSIS — Z853 Personal history of malignant neoplasm of breast: Secondary | ICD-10-CM | POA: Insufficient documentation

## 2018-09-23 DIAGNOSIS — Z79899 Other long term (current) drug therapy: Secondary | ICD-10-CM | POA: Diagnosis not present

## 2018-09-23 DIAGNOSIS — C50512 Malignant neoplasm of lower-outer quadrant of left female breast: Secondary | ICD-10-CM

## 2018-09-23 DIAGNOSIS — Z23 Encounter for immunization: Secondary | ICD-10-CM | POA: Diagnosis not present

## 2018-09-23 NOTE — Assessment & Plan Note (Addendum)
Left breast DCIS high-grade with calcifications 1.5 cm status post lumpectomy 01/01/2010, ER 99%, PR 100% status post radiation therapy on 03/05/2010 to 04/30/2010, tamoxifen 20 mg daily from June 2011 - June 2016  Screening mammogram detected 6 mm mass at 4 o'clock position relapse in the left breast 4 cm from the nipple grade 2 IDC ER 80%, PR 10%, HER-2 negative IHC 2+ and fish ratio 1.23, Ki-67 2% T1b N0 stage Ia  Pathology and radiology counseling: Discussed with the patient, the details of pathology including the type of breast cancer,the clinical staging, the significance of ER, PR and HER-2/neu receptors and the implications for treatment. After reviewing the pathology in detail, we proceeded to discuss the different treatment options between surgery, radiation, chemotherapy, antiestrogen therapies.  Recommendation: 1.  Left mastectomy with sentinel lymph node biopsy 2. adjuvant aromatase inhibitor antiestrogen therapy  Return to clinic after mastectomy to discuss pathology report

## 2018-09-23 NOTE — Progress Notes (Signed)
Patient Care Team: Maury Dus, MD as PCP - General (Family Medicine) Amada Kingfisher, MD (Inactive) as Consulting Physician (Hematology and Oncology)  DIAGNOSIS:  Encounter Diagnosis  Name Primary?  . Malignant neoplasm of lower-outer quadrant of left breast of female, estrogen receptor positive ([place])     SUMMARY OF ONCOLOGIC HISTORY:   Malignant neoplasm of lower-outer quadrant of left breast of female, estrogen receptor positive ([place])   11/21/2009 Initial Diagnosis    Left breast DCIS high-grade with calcifications 1.5 cm status post lumpectomy 01/01/2010, ER 99%, PR 100% status post radiation therapy on 03/05/2010 to 04/30/2010, tamoxifen 20 mg daily from June 2011 - June 2016    09/07/2018 Relapse/Recurrence    Screening mammogram detected 6 mm mass at 4 o'clock position relapse in the left breast 4 cm from the nipple grade 2 IDC ER 80%, PR 10%, HER-2 negative IHC 2+ and fish ratio 1.23, Ki-67 2% T1b N0 stage Ia     CHIEF COMPLIANT: Relapse of breast cancer  INTERVAL HISTORY: Erin Gillespie is a 77 year old with above-mentioned history of DCIS treated with lumpectomy radiation and 5 years of tamoxifen therapy that was completed in June 2016, she presented with a screening mammogram that detected a 6 mm mass at 4 o'clock position.  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  She was seen by surgery who recommended mastectomy and sentinel lymph node biopsy and she was referred to [place] for discussion regarding adjuvant treatment options.  She is here today accompanied by her daughter.  REVIEW OF SYSTEMS:   Constitutional: Denies fevers, chills or abnormal weight loss Eyes: Denies blurriness of vision Ears, nose, mouth, throat, and face: Denies mucositis or sore throat Respiratory: Denies cough, dyspnea or wheezes Cardiovascular: Denies palpitation, chest discomfort Gastrointestinal:  Denies nausea, heartburn  or change in bowel habits Skin: Denies abnormal skin rashes Lymphatics: Denies new lymphadenopathy or easy bruising Neurological:Denies numbness, tingling or new weaknesses Behavioral/Psych: Mood is stable, no new changes  Extremities: No lower extremity edema Breast:  denies any pain or lumps or nodules in either breasts All other systems were reviewed with the patient and are negative.  I have reviewed the past medical history, past surgical history, social history and family history with the patient and they are unchanged from previous note.  ALLERGIES:  is allergic to codeine and sulfa antibiotics.  MEDICATIONS:  Current Outpatient Medications  Medication Sig Dispense Refill  . ALPRAZolam (XANAX) 0.5 MG tablet Take 0.5 mg by mouth at bedtime as needed. 1/2-1  tablet BID as need for anxiety/sleep    . diltiazem (TIAZAC) 300 MG 24 hr capsule Take 300 mg by mouth daily.    Marland Kitchen lisinopril (PRINIVIL,ZESTRIL) 10 MG tablet Take 5 mg by mouth daily. 1/2 tablet PO q AM    . naproxen (NAPROSYN) 500 MG tablet Take 1 tablet (500 mg total) by mouth 2 (two) times daily with a meal. (Patient taking differently: Take 500 mg by mouth 2 (two) times daily as needed for mild pain or moderate pain. ) 30 tablet 1  . pravastatin (PRAVACHOL) 40 MG tablet Take 40 mg by mouth daily.    Marland Kitchen spironolactone (ALDACTONE) 25 MG tablet Take 25 mg by mouth daily.     No current facility-administered medications for this visit.     PHYSICAL EXAMINATION: ECOG PERFORMANCE STATUS: 0 - Asymptomatic  Vitals:   09/23/18 0817  BP: 137/75  Pulse:  72  Resp: 17  Temp: 98 F (36.7 C)  SpO2: 96%   Filed Weights   09/23/18 0817  Weight: 187 lb 4.8 oz (85 kg)    GENERAL:alert, no distress and comfortable SKIN: skin color, texture, turgor are normal, no rashes or significant lesions EYES: normal, Conjunctiva are pink and non-injected, sclera clear OROPHARYNX:no exudate, no erythema and lips, buccal mucosa, and tongue  normal  NECK: supple, thyroid normal size, non-tender, without nodularity LYMPH:  no palpable lymphadenopathy in the cervical, axillary or inguinal LUNGS: clear to auscultation and percussion with normal breathing effort HEART: regular rate & rhythm and no murmurs and no lower extremity edema ABDOMEN:abdomen soft, non-tender and normal bowel sounds MUSCULOSKELETAL:no cyanosis of digits and no clubbing  NEURO: alert & oriented x 3 with fluent speech, no focal motor/sensory deficits EXTREMITIES: No lower extremity edema BREAST: No palpable masses or nodules in either right or left breasts. No palpable axillary supraclavicular or infraclavicular adenopathy no breast tenderness or nipple discharge. (exam performed in the presence of a chaperone)  LABORATORY DATA:  I have reviewed the data as listed CMP Latest Ref Rng & Units 05/25/2015 02/24/2015 02/23/2014  Glucose 65 - 99 mg/dL 145(H) 93 101  BUN 6 - 20 mg/dL 16 13.7 12.5  Creatinine 0.44 - 1.00 mg/dL 0.96 0.9 0.9  Sodium 135 - 145 mmol/L 136 140 139  Potassium 3.5 - 5.1 mmol/L 3.9 4.1 3.9  Chloride 101 - 111 mmol/L 99(L) - -  CO2 22 - 32 mmol/L _0 Calcium 8.9 - 10.3 mg/dL 9.0 9.9 9.5  Total Protein 6.5 - 8.1 g/dL 7.5 7.4 7.4  Total Bilirubin 0.3 - 1.2 mg/dL 0.8 0.44 0.45  Alkaline Phos 38 - 126 U/L 54 53 50  AST 15 - 41 U/L _1 ALT 14 - 54 U/L _2 Lab Results  Component Value Date   WBC 16.6 (H) 05/25/2015   HGB 14.9 05/25/2015   HCT 42.9 05/25/2015   MCV 90.1 05/25/2015   PLT 321 05/25/2015   NEUTROABS 15.8 (H) 05/25/2015    ASSESSMENT & PLAN:  Malignant neoplasm of lower-outer quadrant of left breast of female, estrogen receptor positive ([place]) Left breast DCIS high-grade with calcifications 1.5 cm status post lumpectomy 01/01/2010, ER 99%, PR 100% status post radiation therapy on 03/05/2010 to 04/30/2010, tamoxifen 20 mg daily from June 2011 - June 2016  Screening mammogram detected 6 mm mass at 4  o'clock position relapse in the left breast 4 cm from the nipple grade 2 IDC ER 80%, PR 10%, HER-2 negative IHC 2+ and fish ratio 1.23, Ki-67 2% T1b N0 stage Ia  Pathology and radiology counseling: Discussed with the patient, the details of pathology including the type of breast cancer,the clinical staging, the significance of ER, PR and HER-2/neu receptors and the implications for treatment. After reviewing the pathology in detail, we proceeded to discuss the different treatment options between surgery, radiation, chemotherapy, antiestrogen therapies.  Recommendation: 1.  Left mastectomy with sentinel lymph node biopsy 2. adjuvant aromatase inhibitor antiestrogen therapy with letrozole 2.5 mg daily x5 to 7 years  I did not recommend Oncotype DX testing because of the favorable nature of her breast cancer and her advanced age. Return to clinic after mastectomy to discuss pathology report  No orders of the defined types were placed in this encounter.  The patient has a good understanding of the overall plan. she agrees with it. she will call with any  problems that may develop before the next visit here.   Harriette Ohara, MD 09/23/18

## 2018-09-23 NOTE — Telephone Encounter (Signed)
Per 10/16 los, no scheduling orders.

## 2018-09-28 ENCOUNTER — Ambulatory Visit: Payer: Self-pay | Admitting: General Surgery

## 2018-10-02 ENCOUNTER — Telehealth: Payer: Self-pay | Admitting: Hematology and Oncology

## 2018-10-02 NOTE — Telephone Encounter (Signed)
Appt scheduled patient notified per 10/24 sch msg

## 2018-10-08 NOTE — Pre-Procedure Instructions (Signed)
ANAVICTORIA WILK  10/08/2018      [facility] ([facility]), [place] [address] [place] ([place]) [place] [address] Phone: [phone] Fax: [phone]    Your procedure is scheduled on Monday, November 4th.  Report to [facility] Admitting at 6:00 A.M.  Call this number if you have problems the morning of surgery:  [phone]   Remember:  Do not eat after midnight.  You may drink clear liquids until 5:00 A.M .  Clear liquids allowed are: Water, Juice (non-citric and without pulp), Clear Tea, Black Coffee only and Gatorade    Take these medicines the morning of surgery with A SIP OF WATER  diltiazem [facility])  As of today, STOP taking any Aspirin(unless otherwise instructed by your surgeon), Aleve, Naproxen, Ibuprofen, Motrin, Advil, Goody's, BC's, all herbal medications, fish oil, and all vitamins    Do not wear jewelry, make-up or nail polish.  Do not wear lotions, powders, or perfumes, or deodorant.  Do not shave 48 hours prior to surgery.    Do not bring valuables to the hospital.  [facility] is not responsible for any belongings or valuables.  Eyeglasses, contacts, hearing aids, body piercing's, dentures or bridgework may not be worn into surgery.  Leave your suitcase in the car.  After surgery it may be brought to your room.  For patients admitted to the hospital, discharge time will be determined by your treatment team.  Patients discharged the day of surgery will not be allowed to drive home.   Special instructions:   [place]- Preparing For Surgery  Before surgery, you can play an important role. Because skin is not sterile, your skin needs to be as free of germs as possible. You can reduce the number of germs on your skin by washing with CHG (chlorahexidine gluconate) Soap before surgery.  CHG is an antiseptic cleaner which kills germs and bonds with the skin to continue killing germs even after washing.    Oral  Hygiene is also important to reduce your risk of infection.  Remember - BRUSH YOUR TEETH THE MORNING OF SURGERY WITH YOUR REGULAR TOOTHPASTE  Please do not use if you have an allergy to CHG or antibacterial soaps. If your skin becomes reddened/irritated stop using the CHG.  Do not shave (including legs and underarms) for at least 48 hours prior to first CHG shower. It is OK to shave your face.  Please follow these instructions carefully.   1. Shower the NIGHT BEFORE SURGERY and the MORNING OF SURGERY with CHG.   2. If you chose to wash your hair, wash your hair first as usual with your normal shampoo.  3. After you shampoo, rinse your hair and body thoroughly to remove the shampoo.  4. Use CHG as you would any other liquid soap. You can apply CHG directly to the skin and wash gently with a scrungie or a clean washcloth.   5. Apply the CHG Soap to your body ONLY FROM THE NECK DOWN.  Do not use on open wounds or open sores. Avoid contact with your eyes, ears, mouth and genitals (private parts). Wash Face and genitals (private parts)  with your normal soap.  6. Wash thoroughly, paying special attention to the area where your surgery will be performed.  7. Thoroughly rinse your body with warm water from the neck down.  8. DO NOT shower/wash with your normal soap after using and rinsing off the CHG  Soap.  9. Pat yourself dry with a CLEAN TOWEL.  10. Wear CLEAN PAJAMAS to bed the night before surgery, wear comfortable clothes the morning of surgery  11. Place CLEAN SHEETS on your bed the night of your first shower and DO NOT SLEEP WITH PETS.    Day of Surgery:  Do not apply any deodorants/lotions.  Please wear clean clothes to the hospital/surgery center.   Remember to brush your teeth WITH YOUR REGULAR TOOTHPASTE.  Please read over the following fact sheets that you were given.

## 2018-10-09 ENCOUNTER — Encounter (HOSPITAL_COMMUNITY)
Admission: RE | Admit: 2018-10-09 | Discharge: 2018-10-09 | Disposition: A | Discharge status: Home / Self Care | Payer: Medicare Other | Source: Ambulatory Visit | Attending: General Surgery | Admitting: General Surgery

## 2018-10-09 ENCOUNTER — Encounter (HOSPITAL_COMMUNITY): Payer: Self-pay

## 2018-10-09 ENCOUNTER — Other Ambulatory Visit: Payer: Self-pay

## 2018-10-09 DIAGNOSIS — Z01818 Encounter for other preprocedural examination: Secondary | ICD-10-CM | POA: Insufficient documentation

## 2018-10-09 HISTORY — DX: Cardiac murmur, unspecified: R01.1

## 2018-10-09 LAB — CBC
HEMATOCRIT: 38.4 % (ref 36.0–46.0)
HEMOGLOBIN: 12.3 g/dL (ref 12.0–15.0)
MCH: 28.7 pg (ref 26.0–34.0)
MCHC: 32 g/dL (ref 30.0–36.0)
MCV: 89.7 fL (ref 80.0–100.0)
NRBC: 0 % (ref 0.0–0.2)
Platelets: 363 10*3/uL (ref 150–400)
RBC: 4.28 MIL/uL (ref 3.87–5.11)
RDW: 13 % (ref 11.5–15.5)
WBC: 10 10*3/uL (ref 4.0–10.5)

## 2018-10-09 LAB — BASIC METABOLIC PANEL
ANION GAP: 5 (ref 5–15)
BUN: 12 mg/dL (ref 8–23)
CALCIUM: 9.4 mg/dL (ref 8.9–10.3)
CHLORIDE: 105 mmol/L (ref 98–111)
CO2: 27 mmol/L (ref 22–32)
Creatinine, Ser: 0.93 mg/dL (ref 0.44–1.00)
GFR calc non Af Amer: 58 mL/min — ABNORMAL LOW (ref >60)
Glucose, Bld: 103 mg/dL — ABNORMAL HIGH (ref 70–99)
Potassium: 3.7 mmol/L (ref 3.5–5.1)
SODIUM: 137 mmol/L (ref 135–145)

## 2018-10-09 NOTE — Progress Notes (Signed)
PCP - Dr. Maury Dus Cardiologist - denies  Chest x-ray - N/A EKG - 10/09/18 Stress Test - denies ECHO - 5+ years ago Cardiac Cath - denies  Sleep Study - denies Aspirin Instructions: N/A  Anesthesia review: No  Patient denies shortness of breath, fever, cough and chest pain at PAT appointment   Patient verbalized understanding of instructions that were given to them at the PAT appointment. Patient was also instructed that they will need to review over the PAT instructions again at home before surgery.

## 2018-10-11 NOTE — Anesthesia Preprocedure Evaluation (Addendum)
Anesthesia Evaluation  Patient identified by MRN, date of birth, ID band Patient awake    Reviewed: Allergy & Precautions, NPO status , Patient's Chart, lab work & pertinent test results  Airway Mallampati: II  TM Distance: >3 FB Neck ROM: Full    Dental  (+) Dental Advisory Given, Partial Lower, Partial Upper   Pulmonary neg pulmonary ROS,    Pulmonary exam normal breath sounds clear to auscultation       Cardiovascular hypertension, Normal cardiovascular exam+ Valvular Problems/Murmurs  Rhythm:Regular Rate:Normal     Neuro/Psych PSYCHIATRIC DISORDERS Anxiety negative neurological ROS     GI/Hepatic negative GI ROS, Neg liver ROS,   Endo/Other  negative endocrine ROS  Renal/GU negative Renal ROS     Musculoskeletal negative musculoskeletal ROS (+)   Abdominal   Peds  Hematology negative hematology ROS (+)   Anesthesia Other Findings   Reproductive/Obstetrics negative OB ROS                            Anesthesia Physical Anesthesia Plan  ASA: II  Anesthesia Plan: General   Post-op Pain Management: GA combined w/ Regional for post-op pain   Induction: Intravenous  PONV Risk Score and Plan: 3 and Ondansetron, Dexamethasone and Treatment may vary due to age or medical condition  Airway Management Planned: LMA  Additional Equipment:   Intra-op Plan:   Post-operative Plan: Extubation in OR  Informed Consent: I have reviewed the patients History and Physical, chart, labs and discussed the procedure including the risks, benefits and alternatives for the proposed anesthesia with the patient or authorized representative who has indicated his/her understanding and acceptance.   Dental advisory given  Plan Discussed with: CRNA  Anesthesia Plan Comments:         Anesthesia Quick Evaluation

## 2018-10-12 ENCOUNTER — Ambulatory Visit (HOSPITAL_COMMUNITY)
Admission: RE | Admit: 2018-10-12 | Discharge: 2018-10-13 | Disposition: A | Discharge status: Home / Self Care | Payer: Medicare Other | Source: Ambulatory Visit | Attending: General Surgery | Admitting: General Surgery

## 2018-10-12 ENCOUNTER — Encounter (HOSPITAL_COMMUNITY): Payer: Self-pay | Admitting: Certified Registered Nurse Anesthetist

## 2018-10-12 ENCOUNTER — Ambulatory Visit (HOSPITAL_COMMUNITY): Payer: Medicare Other | Admitting: Anesthesiology

## 2018-10-12 ENCOUNTER — Encounter (HOSPITAL_COMMUNITY)
Admission: RE | Admit: 2018-10-12 | Discharge: 2018-10-12 | Disposition: A | Discharge status: Home / Self Care | Payer: Medicare Other | Source: Ambulatory Visit | Attending: General Surgery | Admitting: General Surgery

## 2018-10-12 ENCOUNTER — Other Ambulatory Visit: Payer: Self-pay

## 2018-10-12 ENCOUNTER — Encounter (HOSPITAL_COMMUNITY)
Admission: RE | Disposition: A | Discharge status: Home / Self Care | Payer: Self-pay | Source: Ambulatory Visit | Attending: General Surgery

## 2018-10-12 DIAGNOSIS — C50512 Malignant neoplasm of lower-outer quadrant of left female breast: Secondary | ICD-10-CM | POA: Diagnosis not present

## 2018-10-12 DIAGNOSIS — Z833 Family history of diabetes mellitus: Secondary | ICD-10-CM | POA: Insufficient documentation

## 2018-10-12 DIAGNOSIS — Z820 Family history of epilepsy and other diseases of the nervous system: Secondary | ICD-10-CM | POA: Diagnosis not present

## 2018-10-12 DIAGNOSIS — Z923 Personal history of irradiation: Secondary | ICD-10-CM | POA: Diagnosis not present

## 2018-10-12 DIAGNOSIS — G8918 Other acute postprocedural pain: Secondary | ICD-10-CM | POA: Diagnosis not present

## 2018-10-12 DIAGNOSIS — Z882 Allergy status to sulfonamides status: Secondary | ICD-10-CM | POA: Diagnosis not present

## 2018-10-12 DIAGNOSIS — Z811 Family history of alcohol abuse and dependence: Secondary | ICD-10-CM | POA: Insufficient documentation

## 2018-10-12 DIAGNOSIS — Z823 Family history of stroke: Secondary | ICD-10-CM | POA: Diagnosis not present

## 2018-10-12 DIAGNOSIS — E7800 Pure hypercholesterolemia, unspecified: Secondary | ICD-10-CM | POA: Insufficient documentation

## 2018-10-12 DIAGNOSIS — Z79899 Other long term (current) drug therapy: Secondary | ICD-10-CM | POA: Diagnosis not present

## 2018-10-12 DIAGNOSIS — Z170 Estrogen receptor positive status [ER+]: Secondary | ICD-10-CM | POA: Diagnosis not present

## 2018-10-12 DIAGNOSIS — I10 Essential (primary) hypertension: Secondary | ICD-10-CM | POA: Diagnosis not present

## 2018-10-12 DIAGNOSIS — F419 Anxiety disorder, unspecified: Secondary | ICD-10-CM | POA: Diagnosis not present

## 2018-10-12 DIAGNOSIS — Z885 Allergy status to narcotic agent status: Secondary | ICD-10-CM | POA: Insufficient documentation

## 2018-10-12 DIAGNOSIS — Z9049 Acquired absence of other specified parts of digestive tract: Secondary | ICD-10-CM | POA: Insufficient documentation

## 2018-10-12 DIAGNOSIS — C50912 Malignant neoplasm of unspecified site of left female breast: Secondary | ICD-10-CM | POA: Diagnosis present

## 2018-10-12 HISTORY — PX: SIMPLE MASTECTOMY WITH AXILLARY SENTINEL NODE BIOPSY: SHX6098

## 2018-10-12 SURGERY — SIMPLE MASTECTOMY WITH AXILLARY SENTINEL NODE BIOPSY
Anesthesia: General | Site: Breast | Laterality: Left

## 2018-10-12 MED ORDER — LACTATED RINGERS IV SOLN
INTRAVENOUS | Status: DC
  Administered 2018-10-12 (×2): via INTRAVENOUS

## 2018-10-12 MED ORDER — FENTANYL CITRATE (PF) 100 MCG/2ML IJ SOLN
INTRAMUSCULAR | Status: AC
  Filled 2018-10-12: qty 2

## 2018-10-12 MED ORDER — FENTANYL CITRATE (PF) 100 MCG/2ML IJ SOLN
25.0000 ug | INTRAMUSCULAR | Status: DC | PRN
  Administered 2018-10-12: 25 ug via INTRAVENOUS
  Filled 2018-10-12: qty 2

## 2018-10-12 MED ORDER — ACETAMINOPHEN 500 MG PO TABS
1000.0000 mg | ORAL_TABLET | ORAL | Status: AC
  Administered 2018-10-12: 1000 mg via ORAL

## 2018-10-12 MED ORDER — TRAMADOL HCL 50 MG PO TABS
50.0000 mg | ORAL_TABLET | Freq: Four times a day (QID) | ORAL | Status: DC | PRN
  Administered 2018-10-12 (×2): 50 mg via ORAL
  Filled 2018-10-12 (×3): qty 1

## 2018-10-12 MED ORDER — FENTANYL CITRATE (PF) 100 MCG/2ML IJ SOLN
25.0000 ug | INTRAMUSCULAR | Status: DC | PRN
  Administered 2018-10-12 (×2): 50 ug via INTRAVENOUS

## 2018-10-12 MED ORDER — CHLORHEXIDINE GLUCONATE CLOTH 2 % EX PADS: 6.0000 | MEDICATED_PAD | Freq: Once | CUTANEOUS | Status: DC

## 2018-10-12 MED ORDER — PANTOPRAZOLE SODIUM 40 MG IV SOLR
40.0000 mg | Freq: Every day | INTRAVENOUS | Status: DC
  Administered 2018-10-12: 40 mg via INTRAVENOUS
  Filled 2018-10-12: qty 40

## 2018-10-12 MED ORDER — FENTANYL CITRATE (PF) 250 MCG/5ML IJ SOLN
INTRAMUSCULAR | Status: AC
  Filled 2018-10-12: qty 5

## 2018-10-12 MED ORDER — GABAPENTIN 300 MG PO CAPS
300.0000 mg | ORAL_CAPSULE | ORAL | Status: AC
  Administered 2018-10-12: 300 mg via ORAL

## 2018-10-12 MED ORDER — KCL IN DEXTROSE-NACL 20-5-0.9 MEQ/L-%-% IV SOLN
INTRAVENOUS | Status: DC
  Administered 2018-10-12: 15:00:00 via INTRAVENOUS
  Filled 2018-10-12: qty 1000

## 2018-10-12 MED ORDER — GABAPENTIN 300 MG PO CAPS
ORAL_CAPSULE | ORAL | Status: AC
  Administered 2018-10-12: 300 mg via ORAL
  Filled 2018-10-12: qty 1

## 2018-10-12 MED ORDER — KETOROLAC TROMETHAMINE 15 MG/ML IJ SOLN
15.0000 mg | Freq: Four times a day (QID) | INTRAMUSCULAR | Status: DC | PRN
  Administered 2018-10-13 (×2): 15 mg via INTRAVENOUS
  Filled 2018-10-12 (×2): qty 1

## 2018-10-12 MED ORDER — CEFAZOLIN SODIUM-DEXTROSE 2-4 GM/100ML-% IV SOLN
INTRAVENOUS | Status: AC
  Filled 2018-10-12: qty 100

## 2018-10-12 MED ORDER — ONDANSETRON HCL 4 MG/2ML IJ SOLN
INTRAMUSCULAR | Status: AC
  Filled 2018-10-12: qty 2

## 2018-10-12 MED ORDER — EPHEDRINE SULFATE 50 MG/ML IJ SOLN
INTRAMUSCULAR | Status: DC | PRN
  Administered 2018-10-12 (×3): 10 mg via INTRAVENOUS

## 2018-10-12 MED ORDER — SODIUM CHLORIDE 0.9 % IV SOLN
INTRAVENOUS | Status: DC | PRN
  Administered 2018-10-12: 40 ug/min via INTRAVENOUS

## 2018-10-12 MED ORDER — METHOCARBAMOL 500 MG PO TABS
ORAL_TABLET | ORAL | Status: AC
  Filled 2018-10-12: qty 1

## 2018-10-12 MED ORDER — METHYLENE BLUE 0.5 % INJ SOLN
INTRAVENOUS | Status: AC
  Filled 2018-10-12: qty 10

## 2018-10-12 MED ORDER — MEPERIDINE HCL 50 MG/ML IJ SOLN: 6.2500 mg | INTRAMUSCULAR | Status: DC | PRN

## 2018-10-12 MED ORDER — EPHEDRINE 5 MG/ML INJ
INTRAVENOUS | Status: AC
  Filled 2018-10-12: qty 10

## 2018-10-12 MED ORDER — FENTANYL CITRATE (PF) 100 MCG/2ML IJ SOLN
INTRAMUSCULAR | Status: AC
  Administered 2018-10-12: 100 ug via INTRAVENOUS
  Filled 2018-10-12: qty 2

## 2018-10-12 MED ORDER — OXYCODONE HCL 5 MG PO TABS
ORAL_TABLET | ORAL | Status: AC
  Filled 2018-10-12: qty 1

## 2018-10-12 MED ORDER — MIDAZOLAM HCL 2 MG/2ML IJ SOLN
INTRAMUSCULAR | Status: AC
  Filled 2018-10-12: qty 2

## 2018-10-12 MED ORDER — DILTIAZEM HCL ER COATED BEADS 300 MG PO CP24
300.0000 mg | ORAL_CAPSULE | Freq: Every day | ORAL | Status: DC
  Administered 2018-10-12 – 2018-10-13 (×2): 300 mg via ORAL
  Filled 2018-10-12 (×4): qty 1

## 2018-10-12 MED ORDER — HEPARIN SODIUM (PORCINE) 5000 UNIT/ML IJ SOLN
5000.0000 [IU] | Freq: Three times a day (TID) | INTRAMUSCULAR | Status: DC
  Administered 2018-10-13 (×2): 5000 [IU] via SUBCUTANEOUS
  Filled 2018-10-12 (×2): qty 1

## 2018-10-12 MED ORDER — PRAVASTATIN SODIUM 40 MG PO TABS: 40.0000 mg | ORAL_TABLET | Freq: Every evening | ORAL | Status: DC

## 2018-10-12 MED ORDER — ONDANSETRON 4 MG PO TBDP: 4.0000 mg | ORAL_TABLET | Freq: Four times a day (QID) | ORAL | Status: DC | PRN

## 2018-10-12 MED ORDER — 0.9 % SODIUM CHLORIDE (POUR BTL) OPTIME
TOPICAL | Status: DC | PRN
  Administered 2018-10-12: 1000 mL

## 2018-10-12 MED ORDER — CEFAZOLIN SODIUM-DEXTROSE 2-4 GM/100ML-% IV SOLN
2.0000 g | INTRAVENOUS | Status: AC
  Administered 2018-10-12: 2 g via INTRAVENOUS

## 2018-10-12 MED ORDER — DEXAMETHASONE SODIUM PHOSPHATE 10 MG/ML IJ SOLN
INTRAMUSCULAR | Status: DC | PRN
  Administered 2018-10-12: 10 mg via INTRAVENOUS

## 2018-10-12 MED ORDER — ALPRAZOLAM 0.5 MG PO TABS
0.5000 mg | ORAL_TABLET | Freq: Every evening | ORAL | Status: DC | PRN
  Administered 2018-10-13: 0.5 mg via ORAL
  Filled 2018-10-12: qty 1

## 2018-10-12 MED ORDER — FENTANYL CITRATE (PF) 100 MCG/2ML IJ SOLN
100.0000 ug | Freq: Once | INTRAMUSCULAR | Status: AC
  Administered 2018-10-12: 100 ug via INTRAVENOUS

## 2018-10-12 MED ORDER — FENTANYL CITRATE (PF) 100 MCG/2ML IJ SOLN
INTRAMUSCULAR | Status: DC | PRN
  Administered 2018-10-12 (×5): 25 ug via INTRAVENOUS

## 2018-10-12 MED ORDER — ACETAMINOPHEN 500 MG PO TABS
ORAL_TABLET | ORAL | Status: AC
  Administered 2018-10-12: 1000 mg via ORAL
  Filled 2018-10-12: qty 2

## 2018-10-12 MED ORDER — METHOCARBAMOL 500 MG PO TABS
500.0000 mg | ORAL_TABLET | Freq: Four times a day (QID) | ORAL | Status: DC | PRN
  Administered 2018-10-12 – 2018-10-13 (×2): 500 mg via ORAL
  Filled 2018-10-12 (×2): qty 1

## 2018-10-12 MED ORDER — DEXAMETHASONE SODIUM PHOSPHATE 10 MG/ML IJ SOLN
INTRAMUSCULAR | Status: AC
  Filled 2018-10-12: qty 1

## 2018-10-12 MED ORDER — ONDANSETRON HCL 4 MG/2ML IJ SOLN
INTRAMUSCULAR | Status: DC | PRN
  Administered 2018-10-12: 4 mg via INTRAVENOUS

## 2018-10-12 MED ORDER — OXYCODONE HCL 5 MG PO TABS
5.0000 mg | ORAL_TABLET | Freq: Once | ORAL | Status: AC | PRN
  Administered 2018-10-12: 5 mg via ORAL

## 2018-10-12 MED ORDER — LIDOCAINE HCL (CARDIAC) PF 100 MG/5ML IV SOSY
PREFILLED_SYRINGE | INTRAVENOUS | Status: DC | PRN
  Administered 2018-10-12: 60 mg via INTRAVENOUS

## 2018-10-12 MED ORDER — ONDANSETRON HCL 4 MG/2ML IJ SOLN: 4.0000 mg | Freq: Four times a day (QID) | INTRAMUSCULAR | Status: DC | PRN

## 2018-10-12 MED ORDER — OXYCODONE HCL 5 MG/5ML PO SOLN: 5.0000 mg | Freq: Once | ORAL | Status: AC | PRN

## 2018-10-12 MED ORDER — TECHNETIUM TC 99M SULFUR COLLOID FILTERED
1.0000 | Freq: Once | INTRAVENOUS | Status: AC | PRN
  Administered 2018-10-12: 1 via INTRADERMAL

## 2018-10-12 MED ORDER — SPIRONOLACTONE 25 MG PO TABS
25.0000 mg | ORAL_TABLET | Freq: Two times a day (BID) | ORAL | Status: DC
  Administered 2018-10-13: 25 mg via ORAL
  Filled 2018-10-12 (×2): qty 1

## 2018-10-12 MED ORDER — PROPOFOL 10 MG/ML IV BOLUS
INTRAVENOUS | Status: DC | PRN
  Administered 2018-10-12: 60 mg via INTRAVENOUS
  Administered 2018-10-12: 140 mg via INTRAVENOUS

## 2018-10-12 MED ORDER — PHENYLEPHRINE HCL 10 MG/ML IJ SOLN
INTRAMUSCULAR | Status: DC | PRN
  Administered 2018-10-12 (×2): 80 ug via INTRAVENOUS

## 2018-10-12 MED ORDER — PROMETHAZINE HCL 25 MG/ML IJ SOLN: 6.2500 mg | INTRAMUSCULAR | Status: DC | PRN

## 2018-10-12 MED ORDER — PHENYLEPHRINE 40 MCG/ML (10ML) SYRINGE FOR IV PUSH (FOR BLOOD PRESSURE SUPPORT)
PREFILLED_SYRINGE | INTRAVENOUS | Status: AC
  Filled 2018-10-12: qty 10

## 2018-10-12 MED ORDER — LIDOCAINE 2% (20 MG/ML) 5 ML SYRINGE
INTRAMUSCULAR | Status: AC
  Filled 2018-10-12: qty 5

## 2018-10-12 SURGICAL SUPPLY — 55 items
APPLIER CLIP 9.375 MED OPEN (MISCELLANEOUS) ×3
BINDER BREAST LRG (GAUZE/BANDAGES/DRESSINGS) IMPLANT
BINDER BREAST XLRG (GAUZE/BANDAGES/DRESSINGS) ×3 IMPLANT
BIOPATCH RED 1 DISK 7.0 (GAUZE/BANDAGES/DRESSINGS) ×2 IMPLANT
BIOPATCH RED 1IN DISK 7.0MM (GAUZE/BANDAGES/DRESSINGS) ×1
CANISTER SUCT 3000ML PPV (MISCELLANEOUS) ×3 IMPLANT
CHLORAPREP W/TINT 26ML (MISCELLANEOUS) ×3 IMPLANT
CLIP APPLIE 9.375 MED OPEN (MISCELLANEOUS) ×1 IMPLANT
CONT SPEC 4OZ CLIKSEAL STRL BL (MISCELLANEOUS) ×6 IMPLANT
COVER PROBE W GEL 5X96 (DRAPES) ×6 IMPLANT
COVER SURGICAL LIGHT HANDLE (MISCELLANEOUS) ×3 IMPLANT
COVER WAND RF STERILE (DRAPES) ×3 IMPLANT
DERMABOND ADHESIVE PROPEN (GAUZE/BANDAGES/DRESSINGS) ×2
DERMABOND ADVANCED (GAUZE/BANDAGES/DRESSINGS) ×2
DERMABOND ADVANCED .7 DNX12 (GAUZE/BANDAGES/DRESSINGS) ×1 IMPLANT
DERMABOND ADVANCED .7 DNX6 (GAUZE/BANDAGES/DRESSINGS) ×1 IMPLANT
DEVICE DISSECT PLASMABLAD 3.0S (MISCELLANEOUS) ×1 IMPLANT
DRAIN CHANNEL 19F RND (DRAIN) ×3 IMPLANT
DRAPE CHEST BREAST 15X10 FENES (DRAPES) ×3 IMPLANT
DRSG PAD ABDOMINAL 8X10 ST (GAUZE/BANDAGES/DRESSINGS) ×3 IMPLANT
DRSG TEGADERM 2-3/8X2-3/4 SM (GAUZE/BANDAGES/DRESSINGS) ×3 IMPLANT
ELECT CAUTERY BLADE 6.4 (BLADE) ×3 IMPLANT
ELECT REM PT RETURN 9FT ADLT (ELECTROSURGICAL) ×3
ELECTRODE REM PT RTRN 9FT ADLT (ELECTROSURGICAL) ×1 IMPLANT
EVACUATOR SILICONE 100CC (DRAIN) ×3 IMPLANT
GAUZE SPONGE 4X4 12PLY STRL (GAUZE/BANDAGES/DRESSINGS) ×3 IMPLANT
GLOVE BIO SURGEON STRL SZ7.5 (GLOVE) ×3 IMPLANT
GOWN STRL REUS W/ TWL LRG LVL3 (GOWN DISPOSABLE) ×2 IMPLANT
GOWN STRL REUS W/TWL LRG LVL3 (GOWN DISPOSABLE) ×4
KIT BASIN OR (CUSTOM PROCEDURE TRAY) ×3 IMPLANT
KIT TURNOVER KIT B (KITS) ×3 IMPLANT
LIGHT WAVEGUIDE WIDE FLAT (MISCELLANEOUS) IMPLANT
NEEDLE 18GX1X1/2 (RX/OR ONLY) (NEEDLE) IMPLANT
NEEDLE FILTER BLUNT 18X 1/2SAF (NEEDLE)
NEEDLE FILTER BLUNT 18X1 1/2 (NEEDLE) IMPLANT
NEEDLE HYPO 25GX1X1/2 BEV (NEEDLE) IMPLANT
NS IRRIG 1000ML POUR BTL (IV SOLUTION) ×3 IMPLANT
PACK GENERAL/GYN (CUSTOM PROCEDURE TRAY) ×3 IMPLANT
PAD ABD 8X10 STRL (GAUZE/BANDAGES/DRESSINGS) ×3 IMPLANT
PAD ARMBOARD 7.5X6 YLW CONV (MISCELLANEOUS) ×3 IMPLANT
PENCIL SMOKE EVACUATOR (MISCELLANEOUS) ×3 IMPLANT
PLASMABLADE 3.0S (MISCELLANEOUS) ×3
SPECIMEN JAR X LARGE (MISCELLANEOUS) ×3 IMPLANT
SUT ETHILON 3 0 FSL (SUTURE) ×3 IMPLANT
SUT MNCRL AB 4-0 PS2 18 (SUTURE) ×3 IMPLANT
SUT VIC AB 0 CT1 27 (SUTURE) ×2
SUT VIC AB 0 CT1 27XBRD ANBCTR (SUTURE) ×1 IMPLANT
SUT VIC AB 3-0 54X BRD REEL (SUTURE) ×1 IMPLANT
SUT VIC AB 3-0 BRD 54 (SUTURE) ×2
SUT VIC AB 3-0 SH 18 (SUTURE) ×3 IMPLANT
SYR CONTROL 10ML LL (SYRINGE) IMPLANT
TOWEL OR 17X24 6PK STRL BLUE (TOWEL DISPOSABLE) ×3 IMPLANT
TOWEL OR 17X26 10 PK STRL BLUE (TOWEL DISPOSABLE) ×3 IMPLANT
TUBE CONNECTING 12'X1/4 (SUCTIONS)
TUBE CONNECTING 12X1/4 (SUCTIONS) IMPLANT

## 2018-10-12 NOTE — Anesthesia Postprocedure Evaluation (Signed)
Anesthesia Post Note  Patient: Erin Gillespie  Procedure(s) Performed: LEFT  MASTECTOMY WITH  SENTINEL NODE BIOPSY (Left Breast)     Patient location during evaluation: PACU Anesthesia Type: General Level of consciousness: sedated and patient cooperative Pain management: pain level controlled Vital Signs Assessment: post-procedure vital signs reviewed and stable Respiratory status: spontaneous breathing Cardiovascular status: stable Anesthetic complications: no    Last Vitals:  Vitals:   10/12/18 1206 10/12/18 1740  BP: 115/63 (!) 111/59  Pulse: 72 88  Resp: 17 17  Temp: 36.5 C 36.6 C  SpO2: 90% 94%    Last Pain:  Vitals:   10/12/18 1740  TempSrc: Oral  PainSc:                  [name]

## 2018-10-12 NOTE — Transfer of Care (Signed)
Immediate Anesthesia Transfer of Care Note  Patient: Erin Gillespie  Procedure(s) Performed: LEFT  MASTECTOMY WITH  SENTINEL NODE BIOPSY (Left Breast)  Patient Location: PACU  Anesthesia Type:General  Level of Consciousness: awake, alert  and oriented  Airway & Oxygen Therapy: Patient Spontanous Breathing and Patient connected to nasal cannula oxygen  Post-op Assessment: Report given to RN, Post -op Vital signs reviewed and stable and Patient moving all extremities  Post vital signs: Reviewed and stable  Last Vitals:  Vitals Value Taken Time  BP    Temp    Pulse 66 10/12/2018 10:11 AM  Resp 16 10/12/2018 10:11 AM  SpO2 97 % 10/12/2018 10:11 AM  Vitals shown include unvalidated device data.  Last Pain:  Vitals:   10/12/18 0800  TempSrc:   PainSc: 0-No pain         Complications: No apparent anesthesia complications

## 2018-10-12 NOTE — Interval H&P Note (Signed)
History and Physical Interval Note:  10/12/2018 7:49 AM  Erin Gillespie  has presented today for surgery, with the diagnosis of LEFT BREAST CANCER  The various methods of treatment have been discussed with the patient and family. After consideration of risks, benefits and other options for treatment, the patient has consented to  Procedure(s): LEFT  MASTECTOMY WITH  SENTINEL NODE BIOPSY (Left) as a surgical intervention .  The patient's history has been reviewed, patient examined, no change in status, stable for surgery.  I have reviewed the patient's chart and labs.  Questions were answered to the patient's satisfaction.     Autumn Messing III

## 2018-10-12 NOTE — Op Note (Signed)
10/12/2018  9:57 AM  PATIENT:  Erin Gillespie  77 y.o. female  PRE-OPERATIVE DIAGNOSIS:  LEFT BREAST CANCER  POST-OPERATIVE DIAGNOSIS:  LEFT BREAST CANCER  PROCEDURE:  Procedure(s): LEFT  MASTECTOMY WITH  DEEP LEFT AXILLARY SENTINEL NODE BIOPSY (Left)  SURGEON:  Surgeon(s) and Role:    * Jovita Kussmaul, MD - Primary  PHYSICIAN ASSISTANT:   ASSISTANTS: none   ANESTHESIA:   general  EBL:  minimal   BLOOD ADMINISTERED:none  DRAINS: (1) Jackson-Pratt drain(s) with closed bulb suction in the prepectoral space   LOCAL MEDICATIONS USED:  NONE  SPECIMEN:  Source of Specimen:  left mastectomy and sentinel node X 2  DISPOSITION OF SPECIMEN:  PATHOLOGY  COUNTS:  YES  TOURNIQUET:  * No tourniquets in log *  DICTATION: .Dragon Dictation   After informed consent was obtained the patient was brought to the operating room and placed in the supine position on the operating table.  After adequate induction of general anesthesia the patient's left chest, breast, and axillary area were prepped with ChloraPrep, allowed to dry, and draped in usual sterile manner.  An appropriate timeout was performed.  Earlier in the day the patient underwent injection 1 mCi of technetium sulfur colloid in the subareolar position on the left.  The neoprobe was set to technetium in good signal was identified in the left axilla.  Next an elliptical incision was made around the nipple and areola complex in order to minimize the excess skin.  The incision was carried through the skin and subcutaneous tissue sharply with the plasma blade.  Breast hooks were used to elevate the skin flaps anteriorly towards the saline.  Thin skin flaps were then created circumferentially by dissecting between the subcutaneous fat and breast tissue.  This dissection was carried all the way to the chest wall.  Next the breast was removed from the pectoralis muscle with the pectoralis fascia.  Once this was accomplished the breast was removed  from the patient and marked with a stitch on the lateral skin.  The neoprobe was used to then identify a hot spot in the deep left axillary space.  Dissection was carried towards the area of radioactivity sharply with the plasma blade under the direction of the neoprobe.  I was able to remove the lymph node with the radioactivity sharply with the plasma blade.  Ex vivo counts on this node were approximately 1000.  There was a nearby palpable lymph node that was also excised sharply with the plasma blade.  It did not have much radioactivity in it.  These were sent as sentinel nodes numbers 1 and 2.  No other hot or palpable lymph nodes were identified in the left axilla.  Hemostasis was achieved using the plasma blade.  The wound was irrigated with copious amounts of saline.  The lateral axillary tissue was then reattached to the chest wall with a running 0 Vicryl stitch.  Next a small stab incision was made near the anterior axillary line inferior to the operative bed.  A tonsil clamp was placed through this opening and used to bring a 19 [place] round Blake drain into the operative bed.  The drain was curled along the chest wall and the tip of the drain was placed in the axilla.  The drain was anchored to the skin with a 3-0 nylon stitch.  Next the superior and inferior skin flaps were grossly reapproximated with interrupted 3-0 Vicryl stitches.  The skin was then closed with a running  4-0 Monocryl subcuticular stitch.  Dermabond dressings were applied.  The patient tolerated the procedure well.  At the end of the case all needle sponge and instrument counts were correct.  The drain was placed to bulb suction and there was a good seal.  The patient was then awakened and taken to recovery in stable condition.  PLAN OF CARE: Admit for overnight observation  PATIENT DISPOSITION:  PACU - hemodynamically stable.   Delay start of Pharmacological VTE agent (>24hrs) due to surgical blood loss or risk of bleeding:  no

## 2018-10-12 NOTE — Progress Notes (Signed)
Pt requesting additional pain medication. Notified md.

## 2018-10-12 NOTE — Anesthesia Procedure Notes (Signed)
Anesthesia Regional Block: Pectoralis block   Pre-Anesthetic Checklist: ,, timeout performed, Correct Patient, Correct Site, Correct Laterality, Correct Procedure, Correct Position, site marked, Risks and benefits discussed,  Surgical consent,  Pre-op evaluation,  At surgeon's request and post-op pain management  Laterality: Left  Prep: chloraprep       Needles:   Needle Type: Stimiplex     Needle Length: 9cm      Additional Needles:   Procedures:,,,, ultrasound used (permanent image in chart),,,,  Narrative:  Start time: 10/12/2018 7:43 AM End time: 10/12/2018 7:48 AM Injection made incrementally with aspirations every 5 mL.  Performed by: Personally  Anesthesiologist: [name], MD  Additional Notes: Patient tolerated well. Good fascial spread noted.

## 2018-10-12 NOTE — Anesthesia Procedure Notes (Signed)
Procedure Name: LMA Insertion Date/Time: 10/12/2018 8:28 AM Performed by: Bawi Lakins T, CRNA Pre-anesthesia Checklist: Patient identified, Emergency Drugs available, Suction available and Patient being monitored Patient Re-evaluated:Patient Re-evaluated prior to induction Oxygen Delivery Method: Circle system utilized Preoxygenation: Pre-oxygenation with 100% oxygen Induction Type: IV induction Ventilation: Mask ventilation without difficulty LMA: LMA inserted LMA Size: 4.0 Number of attempts: 1 Airway Equipment and Method: Patient positioned with wedge pillow Placement Confirmation: positive ETCO2 and breath sounds checked- equal and bilateral Tube secured with: Tape Dental Injury: Teeth and Oropharynx as per pre-operative assessment

## 2018-10-12 NOTE — H&P (Signed)
Erin Gillespie  Location: [facility] Patient #: [id number] DOB: December 03, 1941 Married / Language: English / Race: White Female   History of Present Illness  The patient is a 77 year old female who presents with breast cancer. We are asked to see the patient in consultation by Dr. Lear Ng to evaluate her for a new left breast cancer. The patient is a 77 year old female who has a history of left breast cancer in 2011 that was treated with lumpectomy and radiation and 5 years of antiestrogen's. She recently went for her routine mammogram at which time she was found to have a 6 mm mass in the lower outer left breast. 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 Her nodes were clinically negative.   Past Surgical History  Breast Biopsy  Left. Gallbladder Surgery - Laparoscopic   Diagnostic Studies History  Colonoscopy  never Mammogram  within last year Pap Smear  1-5 years ago  Allergies Sulfa Drugs  Codeine/Codeine Derivatives   Medication History  ALPRAZolam (0.5MG Tablet, Oral) Active. dilTIAZem HCl ER Beads (300MG Capsule ER 24HR, Oral) Active. Lisinopril (5MG Tablet, Oral) Active. Pravastatin Sodium (40MG Tablet, Oral) Active. Spironazide (25-25MG Tablet, Oral) Active. Medications Reconciled  Social History  Alcohol use  Occasional alcohol use. Caffeine use  Coffee. No drug use  Tobacco use  Never smoker.  Family History Alcohol Abuse  Father. Cerebrovascular Accident  Mother. Diabetes Mellitus  Mother. Seizure disorder  Daughter.  Pregnancy / Birth History  Age at menarche  21 years. Age of menopause  64-55 Gravida  6 Maternal age  48-25 Para  25  Other Problems Anxiety Disorder  Breast Cancer  High blood pressure  Hypercholesterolemia  Lump In Breast     Review of Systems General Present- Appetite Loss and  Fatigue. Not Present- Chills, Fever, Night Sweats, Weight Gain and Weight Loss. Skin Not Present- Change in Wart/Mole, Dryness, Hives, Jaundice, New Lesions, Non-Healing Wounds, Rash and Ulcer. HEENT Present- Wears glasses/contact lenses. Not Present- Earache, Hearing Loss, Hoarseness, Nose Bleed, Oral Ulcers, Ringing in the Ears, Seasonal Allergies, Sinus Pain, Sore Throat, Visual Disturbances and Yellow Eyes. Respiratory Not Present- Bloody sputum, Chronic Cough, Difficulty Breathing, Snoring and Wheezing. Breast Present- Breast Mass and Breast Pain. Not Present- Nipple Discharge and Skin Changes. Cardiovascular Present- Palpitations and Rapid Heart Rate. Not Present- Chest Pain, Difficulty Breathing Lying Down, Leg Cramps, Shortness of Breath and Swelling of Extremities. Gastrointestinal Not Present- Abdominal Pain, Bloating, Bloody Stool, Change in Bowel Habits, Chronic diarrhea, Constipation, Difficulty Swallowing, Excessive gas, Gets full quickly at meals, Hemorrhoids, Indigestion, Nausea, Rectal Pain and Vomiting. Female Genitourinary Not Present- Frequency, Nocturia, Painful Urination, Pelvic Pain and Urgency. Musculoskeletal Not Present- Back Pain, Joint Pain, Joint Stiffness, Muscle Pain, Muscle Weakness and Swelling of Extremities. Neurological Not Present- Decreased Memory, Fainting, Headaches, Numbness, Seizures, Tingling, Tremor, Trouble walking and Weakness. Psychiatric Present- Anxiety. Not Present- Bipolar, Change in Sleep Pattern, Depression, Fearful and Frequent crying. Endocrine Not Present- Cold Intolerance, Excessive Hunger, Hair Changes, Heat Intolerance, Hot flashes and New Diabetes. Hematology Not Present- Blood Thinners, Easy Bruising, Excessive bleeding, Gland problems, HIV and Persistent Infections.  Vitals  Weight: 188 lb Height: 66in Body Surface Area: 1.95 m Body Mass Index: 30.34 kg/m  Temp.: 98.62F(Oral)  Pulse: 141 (Regular)  BP: 132/80 (Sitting,  Left Arm, Standard)       Physical Exam General Mental  Status-Alert. General Appearance-Consistent with stated age. Hydration-Well hydrated. Voice-Normal.  Head and Neck Head-normocephalic, atraumatic with no lesions or palpable masses. Trachea-midline. Thyroid Gland Characteristics - normal size and consistency.  Eye Eyeball - Bilateral-Extraocular movements intact. Sclera/Conjunctiva - Bilateral-No scleral icterus.  Chest and Lung Exam Chest and lung exam reveals -quiet, even and easy respiratory effort with no use of accessory muscles and on auscultation, normal breath sounds, no adventitious sounds and normal vocal resonance. Inspection Chest Wall - Normal. Back - normal.  Breast Note: There is no palpable mass in either breast. There is no palpable axillary, supraclavicular, or cervical lymphadenopathy.   Cardiovascular Cardiovascular examination reveals -normal heart sounds, regular rate and rhythm with no murmurs and normal pedal pulses bilaterally.  Abdomen Inspection Inspection of the abdomen reveals - No Hernias. Skin - Scar - no surgical scars. Palpation/Percussion Palpation and Percussion of the abdomen reveal - Soft, Non Tender, No Rebound tenderness, No Rigidity (guarding) and No hepatosplenomegaly. Auscultation Auscultation of the abdomen reveals - Bowel sounds normal.  Neurologic Neurologic evaluation reveals -alert and oriented x 3 with no impairment of recent or remote memory. Mental Status-Normal.  Musculoskeletal Normal Exam - Left-Upper Extremity Strength Normal and Lower Extremity Strength Normal. Normal Exam - Right-Upper Extremity Strength Normal and Lower Extremity Strength Normal.  Lymphatic Head & Neck  General Head & Neck Lymphatics: Bilateral - Description - Normal. Axillary  General Axillary Region: Bilateral - Description - Normal. Tenderness - Non Tender. Femoral & Inguinal  Generalized Femoral &  Inguinal Lymphatics: Bilateral - Description - Normal. Tenderness - Non Tender.     MALIGNANT NEOPLASM OF LOWER-OUTER QUADRANT OF LEFT BREAST OF FEMALE, ESTROGEN RECEPTOR POSITIVE (C50.512) Impression: The patient appears to have a small recurrent cancer in the lower outer left breast. She has a history of left breast lumpectomy treated with radiation in 2011. Because of this history I would recommend that she consider a left mastectomy and repeat sentinel node mapping. I have discussed with her in detail the risks and benefits of the operation as well as some of the technical aspects and she understands and wishes to proceed. I will go ahead and refer her to medical oncology as well. Current Plans Referred to Oncology, for evaluation and follow up (Oncology). Routine.

## 2018-10-12 NOTE — Plan of Care (Signed)
  Problem: Elimination: Goal: Will not experience complications related to bowel motility Outcome: Progressing Goal: Will not experience complications related to urinary retention Outcome: Progressing   Problem: Safety: Goal: Ability to remain free from injury will improve Outcome: Progressing   Problem: Clinical Measurements: Goal: Ability to maintain clinical measurements within normal limits will improve Outcome: Progressing Goal: Postoperative complications will be avoided or minimized Outcome: Progressing

## 2018-10-12 NOTE — Plan of Care (Signed)

## 2018-10-13 ENCOUNTER — Encounter (HOSPITAL_COMMUNITY): Payer: Self-pay | Admitting: General Surgery

## 2018-10-13 DIAGNOSIS — Z885 Allergy status to narcotic agent status: Secondary | ICD-10-CM | POA: Diagnosis not present

## 2018-10-13 DIAGNOSIS — Z811 Family history of alcohol abuse and dependence: Secondary | ICD-10-CM | POA: Diagnosis not present

## 2018-10-13 DIAGNOSIS — Z9049 Acquired absence of other specified parts of digestive tract: Secondary | ICD-10-CM | POA: Diagnosis not present

## 2018-10-13 DIAGNOSIS — C50512 Malignant neoplasm of lower-outer quadrant of left female breast: Secondary | ICD-10-CM | POA: Diagnosis not present

## 2018-10-13 DIAGNOSIS — Z79899 Other long term (current) drug therapy: Secondary | ICD-10-CM | POA: Diagnosis not present

## 2018-10-13 DIAGNOSIS — Z882 Allergy status to sulfonamides status: Secondary | ICD-10-CM | POA: Diagnosis not present

## 2018-10-13 MED ORDER — METHOCARBAMOL 500 MG PO TABS: 500.0000 mg | ORAL_TABLET | Freq: Four times a day (QID) | ORAL | 1 refills | Status: DC | PRN

## 2018-10-13 MED ORDER — TRAMADOL HCL 50 MG PO TABS: 50.0000 mg | ORAL_TABLET | Freq: Four times a day (QID) | ORAL | 1 refills | Status: DC | PRN

## 2018-10-13 MED ORDER — PANTOPRAZOLE SODIUM 40 MG PO TBEC: 40.0000 mg | DELAYED_RELEASE_TABLET | Freq: Every day | ORAL | Status: DC

## 2018-10-13 NOTE — Discharge Summary (Signed)
Physician Discharge Summary  Patient ID: Erin Gillespie MRN: [id number] DOB/AGE: [birth year] 77 y.o.  Admit date: 10/12/2018 Discharge date: 10/13/2018  Admission Diagnoses:  Discharge Diagnoses:  Active Problems:   Cancer of left female breast  [facility])   Discharged Condition: good  Hospital Course: the pt underwent left mastectomy. She tolerated surgery well. On pod 1 she was ready for discharge home  Consults: None  Significant Diagnostic Studies: none  Treatments: surgery: as above  Discharge Exam: Blood pressure 109/60, pulse 76, temperature 97.6 F (36.4 C), temperature source Oral, resp. rate 17, height 5\' 5"  (1.651 m), weight 81.6 kg, SpO2 92 %. Chest wall: skin flaps look good  Disposition: Discharge disposition: 01-Home or Self Care       Discharge Instructions    Call MD for:  difficulty breathing, headache or visual disturbances   Complete by:  As directed    Call MD for:  extreme fatigue   Complete by:  As directed    Call MD for:  hives   Complete by:  As directed    Call MD for:  persistant dizziness or light-headedness   Complete by:  As directed    Call MD for:  persistant nausea and vomiting   Complete by:  As directed    Call MD for:  redness, tenderness, or signs of infection (pain, swelling, redness, odor or green/yellow discharge around incision site)   Complete by:  As directed    Call MD for:  severe uncontrolled pain   Complete by:  As directed    Call MD for:  temperature >100.4   Complete by:  As directed    Diet - low sodium heart healthy   Complete by:  As directed    Discharge instructions   Complete by:  As directed    Sponge bathe while drains are in. No overhead activity. Empty drain, record output, recharge bulb twice a day   Increase activity slowly   Complete by:  As directed    No wound care   Complete by:  As directed      Allergies as of 10/13/2018      Reactions   Codeine Nausea And Vomiting, Rash   Sulfa Antibiotics  Rash      Medication List    TAKE these medications   ALPRAZolam 0.5 MG tablet Commonly known as:  XANAX Take 0.5 mg by mouth at bedtime as needed. 1/2-1  tablet BID as need for anxiety/sleep   cholecalciferol 1000 units tablet Commonly known as:  VITAMIN D Take 1,000 Units by mouth daily with lunch.   diltiazem 300 MG 24 hr capsule Commonly known as:  TIAZAC Take 300 mg by mouth daily.   Fish Oil 1000 MG Caps Take 1,000 mg by mouth daily with lunch.   methocarbamol 500 MG tablet Commonly known as:  ROBAXIN Take 1 tablet (500 mg total) by mouth every 6 (six) hours as needed for muscle spasms.   multivitamin with minerals Tabs tablet Take 1 tablet by mouth daily with lunch.   naproxen 500 MG tablet Commonly known as:  NAPROSYN Take 1 tablet (500 mg total) by mouth 2 (two) times daily with a meal. What changed:    when to take this  reasons to take this   pravastatin 40 MG tablet Commonly known as:  PRAVACHOL Take 40 mg by mouth every evening.   spironolactone 25 MG tablet Commonly known as:  ALDACTONE Take 25 mg by mouth 2 (two) times daily.  traMADol 50 MG tablet Commonly known as:  ULTRAM Take 1 tablet (50 mg total) by mouth every 6 (six) hours as needed (mild pain).      Follow-up Information    Autumn Messing III, MD Follow up in 2 week(s).   Specialty:  General Surgery Contact information: [address] [place] [id number] [phone]           Signed: Autumn Messing III 10/13/2018, 1:49 PM

## 2018-10-13 NOTE — Progress Notes (Signed)
1 Day Post-Op   Subjective/Chief Complaint: No complaints other than the binder is too tight   Objective: Vital signs in last 24 hours: Temp:  [97.6 F (36.4 C)-97.9 F (36.6 C)] 97.6 F (36.4 C) (11/05 0430) Pulse Rate:  [76-88] 76 (11/05 0430) Resp:  [17] 17 (11/04 1740) BP: (107-111)/(57-60) 109/60 (11/05 0430) SpO2:  [92 %-94 %] 92 % (11/05 0430) Last BM Date: 10/12/18  Intake/Output from previous day: 11/04 0701 - 11/05 0700 In: 1014.5 [P.O.:200; I.V.:814.5] Out: 128 [Drains:78; Blood:50] Intake/Output this shift: No intake/output data recorded.  General appearance: alert and cooperative Resp: clear to auscultation bilaterally Chest wall: skin flaps look good Cardio: regular rate and rhythm GI: soft, non-tender; bowel sounds normal; no masses,  no organomegaly  Lab Results:  No results for input(s): WBC, HGB, HCT, PLT in the last 72 hours. BMET No results for input(s): NA, K, CL, CO2, GLUCOSE, BUN, CREATININE, CALCIUM in the last 72 hours. PT/INR No results for input(s): LABPROT, INR in the last 72 hours. ABG No results for input(s): PHART, HCO3 in the last 72 hours.  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    Anti-infectives: Anti-infectives (From admission, onward)   Start     Dose/Rate Route Frequency Ordered Stop   10/12/18 0700  ceFAZolin (ANCEF) IVPB 2g/100 mL premix     2 g 200 mL/hr over 30 Minutes Intravenous On call to O.R. 10/12/18 0652 10/12/18 0830   10/12/18 0656  ceFAZolin (ANCEF) 2-4 GM/100ML-% IVPB    Note to Pharmacy:  Tamsen Snider   : cabinet override      10/12/18 0656 10/12/18 0830      Assessment/Plan: s/p Procedure(s): LEFT  MASTECTOMY WITH  SENTINEL NODE BIOPSY (Left) Advance diet Discharge  LOS: 0 days    Erin Gillespie III 10/13/2018

## 2018-10-13 NOTE — Progress Notes (Signed)
Discharge instructions reviewed with pt and pt's daughter and prescriptions given.  Also reviewed how to empty and record JP drainage.  Pt and pt's daughter verbalized understanding and had no questions.  Pt discharged in stable condition via wheelchair with daughter.  Eliezer Bottom [place]

## 2018-10-30 ENCOUNTER — Telehealth: Payer: Self-pay | Admitting: Adult Health

## 2018-10-30 ENCOUNTER — Inpatient Hospital Stay: Payer: Medicare Other | Attending: Hematology and Oncology | Admitting: Hematology and Oncology

## 2018-10-30 DIAGNOSIS — Z923 Personal history of irradiation: Secondary | ICD-10-CM | POA: Diagnosis not present

## 2018-10-30 DIAGNOSIS — Z9012 Acquired absence of left breast and nipple: Secondary | ICD-10-CM

## 2018-10-30 DIAGNOSIS — C50512 Malignant neoplasm of lower-outer quadrant of left female breast: Secondary | ICD-10-CM

## 2018-10-30 DIAGNOSIS — Z170 Estrogen receptor positive status [ER+]: Secondary | ICD-10-CM | POA: Diagnosis not present

## 2018-10-30 DIAGNOSIS — Z79899 Other long term (current) drug therapy: Secondary | ICD-10-CM | POA: Diagnosis not present

## 2018-10-30 MED ORDER — LETROZOLE 2.5 MG PO TABS: 2.5000 mg | ORAL_TABLET | Freq: Every day | ORAL | 3 refills | Status: DC

## 2018-10-30 NOTE — Progress Notes (Signed)
Patient Care Team: Maury Dus, MD as PCP - General (Family Medicine) Amada Kingfisher, MD (Inactive) as Consulting Physician (Hematology and Oncology)  DIAGNOSIS:  Encounter Diagnosis  Name Primary?  . 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 01/01/2010, ER 99%, PR 100% status post radiation therapy on 03/05/2010 to 04/30/2010, tamoxifen 20 mg daily from June 2011 - June 2016    09/07/2018 Relapse/Recurrence    Screening mammogram detected 6 mm mass at 4 o'clock position relapse in the left breast 4 cm from the nipple grade 2 IDC ER 80%, PR 10%, HER-2 negative IHC 2+ and fish ratio 1.23, Ki-67 2% T1b N0 stage Ia    10/12/2018 Surgery    Left mastectomy: IDC grade 2, 1.2 cm, high-grade DCIS, margins negative, 0/5 lymph nodes negative, ER 80%, PR 10%, HER-2 negative, Ki-67 2% T1CN0 stage Ia      CHIEF COMPLIANT: Follow-up after left mastectomy  INTERVAL HISTORY: Erin Gillespie is a 26-year with above-mentioned history of recurrent left breast cancer who underwent left mastectomy and is here today to discuss the pathology report.  She is healing and recovering very well from the recent surgery.  She is glad to have the drains taken out.  REVIEW OF SYSTEMS:   Constitutional: Denies fevers, chills or abnormal weight loss Eyes: Denies blurriness of vision Ears, nose, mouth, throat, and face: Denies mucositis or sore throat Respiratory: Denies cough, dyspnea or wheezes Cardiovascular: Denies palpitation, chest discomfort Gastrointestinal:  Denies nausea, heartburn or change in bowel habits Skin: Denies abnormal skin rashes Lymphatics: Denies new lymphadenopathy or easy  bruising Neurological:Denies numbness, tingling or new weaknesses Behavioral/Psych: Mood is stable, no new changes  Extremities: No lower extremity edema Breast: Recent left mastectomy All other systems were reviewed with the patient and are negative.  I have reviewed the past medical history, past surgical history, social history and family history with the patient and they are unchanged from previous note.  ALLERGIES:  is allergic to codeine and sulfa antibiotics.  MEDICATIONS:  Current Outpatient Medications  Medication Sig Dispense Refill  . ALPRAZolam (XANAX) 0.5 MG tablet Take 0.5 mg by mouth at bedtime as needed. 1/2-1  tablet BID as need for anxiety/sleep    . cholecalciferol (VITAMIN D) 1000 units tablet Take 1,000 Units by mouth daily with lunch.    . diltiazem (TIAZAC) 300 MG 24 hr capsule Take 300 mg by mouth daily.    Marland Kitchen letrozole (FEMARA) 2.5 MG tablet Take 1 tablet (2.5 mg total) by mouth daily. 90 tablet 3  . methocarbamol (ROBAXIN) 500 MG tablet Take 1 tablet (500 mg total) by mouth every 6 (six) hours as needed for muscle spasms. 20 tablet 1  . Multiple Vitamin (MULTIVITAMIN WITH MINERALS) TABS tablet Take 1 tablet by mouth daily with lunch.    . naproxen (NAPROSYN) 500 MG tablet Take 1 tablet (500 mg total) by mouth 2 (two) times daily with a meal. (Patient taking differently: Take 500 mg by mouth 2 (two) times daily as needed for mild pain or moderate pain. ) 30 tablet 1  . Omega-3 Fatty Acids (FISH OIL) 1000 MG CAPS Take 1,000 mg by mouth daily with lunch.    . pravastatin (PRAVACHOL)  40 MG tablet Take 40 mg by mouth every evening.     Marland Kitchen spironolactone (ALDACTONE) 25 MG tablet Take 25 mg by mouth 2 (two) times daily.     . traMADol (ULTRAM) 50 MG tablet Take 1 tablet (50 mg total) by mouth every 6 (six) hours as needed (mild pain). 30 tablet 1   No current facility-administered medications for this visit.     PHYSICAL EXAMINATION: ECOG PERFORMANCE STATUS: 1 -  Symptomatic but completely ambulatory  Vitals:   10/30/18 1158  BP: (!) 141/69  Pulse: 66  Resp: 18  Temp: 98.4 F (36.9 C)  SpO2: 99%   There were no vitals filed for this visit.  GENERAL:alert, no distress and comfortable SKIN: skin color, texture, turgor are normal, no rashes or significant lesions EYES: normal, Conjunctiva are pink and non-injected, sclera clear OROPHARYNX:no exudate, no erythema and lips, buccal mucosa, and tongue normal  NECK: supple, thyroid normal size, non-tender, without nodularity LYMPH:  no palpable lymphadenopathy in the cervical, axillary or inguinal LUNGS: clear to auscultation and percussion with normal breathing effort HEART: regular rate & rhythm and no murmurs and no lower extremity edema ABDOMEN:abdomen soft, non-tender and normal bowel sounds MUSCULOSKELETAL:no cyanosis of digits and no clubbing  NEURO: alert & oriented x 3 with fluent speech, no focal motor/sensory deficits EXTREMITIES: No lower extremity edema  LABORATORY DATA:  I have reviewed the data as listed CMP Latest Ref Rng & Units 10/09/2018 05/25/2015 02/24/2015  Glucose 70 - 99 mg/dL 103(H) 145(H) 93  BUN 8 - 23 mg/dL 12 16 13.7  Creatinine 0.44 - 1.00 mg/dL 0.93 0.96 0.9  Sodium 135 - 145 mmol/L 137 136 140  Potassium 3.5 - 5.1 mmol/L 3.7 3.9 4.1  Chloride 98 - 111 mmol/L 105 99(L) -  CO2 22 - 32 mmol/L '27 25 24  ' Calcium 8.9 - 10.3 mg/dL 9.4 9.0 9.9  Total Protein 6.5 - 8.1 g/dL - 7.5 7.4  Total Bilirubin 0.3 - 1.2 mg/dL - 0.8 0.44  Alkaline Phos 38 - 126 U/L - 54 53  AST 15 - 41 U/L - 19 13  ALT 14 - 54 U/L - 14 12    Lab Results  Component Value Date   WBC 10.0 10/09/2018   HGB 12.3 10/09/2018   HCT 38.4 10/09/2018   MCV 89.7 10/09/2018   PLT 363 10/09/2018   NEUTROABS 15.8 (H) 05/25/2015    ASSESSMENT & PLAN:  Malignant neoplasm of lower-outer quadrant of left breast of female, estrogen receptor positive ([place]) 10/12/2018: Left mastectomy: IDC grade 2, 1.2 cm,  high-grade DCIS, margins negative, 0/5 lymph nodes negative, ER 80%, PR 10%, HER-2 negative, Ki-67 2% T1CN0 stage Ia  (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 These include but not limited to insomnia, hot flashes, mood changes, vaginal dryness, bone density loss, and weight gain. We strongly believe that the benefits far outweigh the risks. Patient understands these risks and consented to starting treatment. Planned treatment duration is 7 years.   Return to clinic in 3 months for survivorship care plan visit    No orders of the defined types were placed in this encounter.  The patient has a  good understanding of the overall plan. she agrees with it. she will call with any problems that may develop before the next visit here.   Harriette Ohara, MD 10/30/18

## 2018-10-30 NOTE — Telephone Encounter (Signed)
Gave avs and calendar ° °

## 2018-10-30 NOTE — Assessment & Plan Note (Signed)
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

## 2019-02-15 DIAGNOSIS — C50919 Malignant neoplasm of unspecified site of unspecified female breast: Secondary | ICD-10-CM | POA: Diagnosis not present

## 2019-02-15 DIAGNOSIS — I10 Essential (primary) hypertension: Secondary | ICD-10-CM | POA: Diagnosis not present

## 2019-02-15 DIAGNOSIS — M25562 Pain in left knee: Secondary | ICD-10-CM | POA: Diagnosis not present

## 2019-02-15 DIAGNOSIS — M545 Low back pain: Secondary | ICD-10-CM | POA: Diagnosis not present

## 2019-02-15 DIAGNOSIS — M8580 Other specified disorders of bone density and structure, unspecified site: Secondary | ICD-10-CM | POA: Diagnosis not present

## 2019-02-15 DIAGNOSIS — R000 Tachycardia, unspecified: Secondary | ICD-10-CM | POA: Diagnosis not present

## 2019-02-15 DIAGNOSIS — F331 Major depressive disorder, recurrent, moderate: Secondary | ICD-10-CM | POA: Diagnosis not present

## 2019-02-15 DIAGNOSIS — R609 Edema, unspecified: Secondary | ICD-10-CM | POA: Diagnosis not present

## 2019-02-15 DIAGNOSIS — E7800 Pure hypercholesterolemia, unspecified: Secondary | ICD-10-CM | POA: Diagnosis not present

## 2019-02-15 DIAGNOSIS — E2839 Other primary ovarian failure: Secondary | ICD-10-CM | POA: Diagnosis not present

## 2019-02-15 DIAGNOSIS — F419 Anxiety disorder, unspecified: Secondary | ICD-10-CM | POA: Diagnosis not present

## 2019-02-15 DIAGNOSIS — Z0000 Encounter for general adult medical examination without abnormal findings: Secondary | ICD-10-CM | POA: Diagnosis not present

## 2019-03-01 ENCOUNTER — Inpatient Hospital Stay: Payer: Medicare Other | Admitting: Adult Health

## 2019-03-29 ENCOUNTER — Telehealth: Payer: Self-pay | Admitting: Adult Health

## 2019-03-29 NOTE — Telephone Encounter (Signed)
Rescheduled 5/7 SCP to 5/6 per sch msg and changed to webex. Called patient. No answer and could not leave msg.

## 2019-04-12 ENCOUNTER — Telehealth: Payer: Self-pay | Admitting: Adult Health

## 2019-04-12 NOTE — Telephone Encounter (Signed)
Spoke with pt regarding her Webex visit - they stated that they do not have the capability to do it. They would request a phone call visit. °

## 2019-04-13 NOTE — Progress Notes (Signed)
SURVIVORSHIP VIRTUAL VISIT:  I connected with Erin Gillespie on 04/13/19 at  1:00 PM EDT by telephone and verified that I am speaking with the correct person using two identifiers.   I discussed the limitations, risks, security and privacy concerns of performing an evaluation and management service by telephone and the availability of in person appointments. I also discussed with the patient that there may be a patient responsible charge related to this service. The patient expressed understanding and agreed to proceed.   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 status post lumpectomy 01/01/2010, ER 99%, PR 100% status post radiation therapy on 03/05/2010 to 04/30/2010, tamoxifen 20 mg daily from June 2011 - June 2016    09/07/2018 Relapse/Recurrence    Screening mammogram detected 6 mm mass at 4 o'clock position relapse in the left breast 4 cm from the nipple grade 2 IDC ER 80%, PR 10%, HER-2 negative IHC 2+ and fish ratio 1.23, Ki-67 2% T1b N0 stage Ia    10/12/2018 Surgery    Left mastectomy: IDC grade 2, 1.2 cm, high-grade DCIS, margins negative, 0/5 lymph nodes negative, ER 80%, PR 10%, HER-2 negative, Ki-67 2% T1CN0 stage Ia     11/2018 -  Anti-estrogen oral therapy    Letrozole daily     INTERVAL HISTORY:  Erin Gillespie to review her survivorship care plan detailing her treatment course for breast cancer, as well as monitoring long-term side effects of that treatment, education regarding health maintenance, screening, and overall wellness and health promotion.     Overall, Erin Gillespie reports feeling quite well.  She is taking Letrozole daily.  She denies any issues with this.  She has mild nausea.  She hasn't noted hot flashes, arthralgias, or vaginal dryness.  She is delighted with this. She is walking some, but is not  going out of her house due to concerns for coronavirus.     REVIEW OF SYSTEMS:  Review of Systems  Constitutional: Negative for appetite change, chills, fatigue, fever and unexpected weight change.  HENT:   Negative for hearing loss, lump/mass, sore throat and trouble swallowing.   Eyes: Negative for eye problems and icterus.  Respiratory: Negative for chest tightness, cough and shortness of breath.   Cardiovascular: Negative for chest pain, leg swelling and palpitations.  Gastrointestinal: Negative for abdominal distention, abdominal pain, constipation, diarrhea, nausea and vomiting.  Endocrine: Negative for hot flashes.  Musculoskeletal: Negative for arthralgias.  Skin: Negative for itching and rash.  Neurological: Negative for dizziness, extremity weakness, headaches and numbness.  Hematological: Negative for adenopathy. Does not bruise/bleed easily.  Psychiatric/Behavioral: Negative for depression. The patient is not nervous/anxious.    Breast: Denies any new nodularity, masses, tenderness, nipple changes, or nipple discharge.      ONCOLOGY TREATMENT TEAM:  1. Surgeon:  Dr. Marlou Starks at [facility] 2. Medical Oncologist: Dr. Lindi Adie    PAST MEDICAL/SURGICAL HISTORY:  Past Medical History:  Diagnosis Date  . Anxiety   . Breast cancer ([place])   . Cancer ([place])   . Heart murmur    echo confirmed over 5+ years ago.   Marland Kitchen Hypercholesterolemia   . Hypertension    Past Surgical History:  Procedure Laterality Date  . CATARACT EXTRACTION, BILATERAL Bilateral 2003   approx 2003  . CHOLECYSTECTOMY    . CHOLECYSTECTOMY, LAPAROSCOPIC  1990  .  SIMPLE MASTECTOMY WITH AXILLARY SENTINEL NODE BIOPSY Left 10/12/2018   Procedure: LEFT  MASTECTOMY WITH  SENTINEL NODE BIOPSY;  Surgeon: Jovita Kussmaul, MD;  Location: [facility];  Service: General;  Laterality: Left;     ALLERGIES:  Allergies  Allergen Reactions  . Codeine Nausea And Vomiting and Rash  . Sulfa Antibiotics Rash      CURRENT MEDICATIONS:  Outpatient Encounter Medications as of 04/14/2019  Medication Sig  . ALPRAZolam (XANAX) 0.5 MG tablet Take 0.5 mg by mouth at bedtime as needed. 1/2-1  tablet BID as need for anxiety/sleep  . cholecalciferol (VITAMIN D) 1000 units tablet Take 1,000 Units by mouth daily with lunch.  . diltiazem (TIAZAC) 300 MG 24 hr capsule Take 300 mg by mouth daily.  Marland Kitchen letrozole (FEMARA) 2.5 MG tablet Take 1 tablet (2.5 mg total) by mouth daily.  . methocarbamol (ROBAXIN) 500 MG tablet Take 1 tablet (500 mg total) by mouth every 6 (six) hours as needed for muscle spasms.  . Multiple Vitamin (MULTIVITAMIN WITH MINERALS) TABS tablet Take 1 tablet by mouth daily with lunch.  . naproxen (NAPROSYN) 500 MG tablet Take 1 tablet (500 mg total) by mouth 2 (two) times daily with a meal. (Patient taking differently: Take 500 mg by mouth 2 (two) times daily as needed for mild pain or moderate pain. )  . Omega-3 Fatty Acids (FISH OIL) 1000 MG CAPS Take 1,000 mg by mouth daily with lunch.  . pravastatin (PRAVACHOL) 40 MG tablet Take 40 mg by mouth every evening.   Marland Kitchen spironolactone (ALDACTONE) 25 MG tablet Take 25 mg by mouth 2 (two) times daily.   . traMADol (ULTRAM) 50 MG tablet Take 1 tablet (50 mg total) by mouth every 6 (six) hours as needed (mild pain).   No facility-administered encounter medications on file as of 04/14/2019.      ONCOLOGIC FAMILY HISTORY:  Family History  Problem Relation Age of Onset  . Cancer Brother      GENETIC COUNSELING/TESTING: Not at this time  SOCIAL HISTORY:  Social History   Socioeconomic History  . Marital status: Widowed    Spouse name: Not on file  . Number of children: Not on file  . Years of education: Not on file  . Highest education level: Not on file  Occupational History  . Not on file  Social Needs  . Financial resource strain: Not on file  . Food insecurity:    Worry: Not on file    Inability: Not on file  . Transportation needs:     Medical: Not on file    Non-medical: Not on file  Tobacco Use  . Smoking status: Never Smoker  . Smokeless tobacco: Never Used  Substance and Sexual Activity  . Alcohol use: Yes    Alcohol/week: 2.0 standard drinks    Types: 2 Shots of liquor per week    Comment: occasional   . Drug use: No  . Sexual activity: Not Currently  Lifestyle  . Physical activity:    Days per week: Not on file    Minutes per session: Not on file  . Stress: Not on file  Relationships  . Social connections:    Talks on phone: Not on file    Gets together: Not on file    Attends religious service: Not on file    Active member of club or organization: Not on file    Attends meetings of clubs or organizations: Not on file    Relationship status:  Not on file  . Intimate partner violence:    Fear of current or ex partner: Not on file    Emotionally abused: Not on file    Physically abused: Not on file    Forced sexual activity: Not on file  Other Topics Concern  . Not on file  Social History Narrative  . Not on file     OBSERVATIONS/OBJECTIVE:  Speech is normal, no apparent distress, non pressured, patient breathing unlabored, mood and behavior are normal  LABORATORY DATA:  None for this visit.  DIAGNOSTIC IMAGING:  None for this visit.      ASSESSMENT AND PLAN:  Ms.. Gillespie is a pleasant 78 y.o. female with Stage IA left breast invasive ductal carcinoma, ER+/PR+/HER2-, diagnosed in 10/2018, treated with mastectomy, and anti-estrogen therapy with Letrozole beginning in 11/2018.  She presents to the Survivorship Clinic for our initial meeting and routine follow-up post-completion of treatment for breast cancer.    1. Stage IA left breast cancer:  Erin Gillespie is continuing to recover from definitive treatment for breast cancer. She will follow-up with her medical oncologist, Dr. Lindi Adie in 3 months with history and physical exam per surveillance protocol.  She will continue her anti-estrogen therapy  with Letrozole. Thus far, she is tolerating the Letrozole well, with minimal side effects.. Her mammogram is due 08/2019; orders placed today.  Her breast density is category C. Today, a comprehensive survivorship care plan and treatment summary was reviewed with the patient today detailing her breast cancer diagnosis, treatment course, potential late/long-term effects of treatment, appropriate follow-up care with recommendations for the future, and patient education resources.  A copy of this summary, along with a letter will be sent to the patient's primary care provider via mail/fax/In Basket message after today's visit.    2. Bone health:  Given Erin Gillespie age/history of breast cancer and her current treatment regimen including anti-estrogen therapy with , she is at risk for bone demineralization. I ordered bone density testing for 08/2019.  In the meantime, she was encouraged to increase her consumption of foods rich in calcium, as well as increase her weight-bearing activities.  She was given education on specific activities to promote bone health.  3. Cancer screening:  Due to Erin Gillespie history and her age, she should receive screening for skin cancers, colon cancer, and gynecologic cancers.  The information and recommendations are listed on the patient's comprehensive care plan/treatment summary and were reviewed in detail with the patient.    4. Health maintenance and wellness promotion: Erin Gillespie was encouraged to consume 5-7 servings of fruits and vegetables per day. We reviewed the "Nutrition Rainbow" handout.  She was also encouraged to engage in moderate to vigorous exercise for 30 minutes per day most days of the week. We discussed the [employer] fitness program, which is designed for cancer survivors to help them become more physically fit after cancer treatments.  She was instructed to limit her alcohol consumption and continue to abstain from tobacco use.     5. Support  services/counseling: It is not uncommon for this period of the patient's cancer care trajectory to be one of many emotions and stressors.  We discussed how this can be increasingly difficult during the times of quarantine and social distancing due to the COVID-19 pandemic.   She was given information regarding our available services and encouraged to contact me with any questions or for help enrolling in any of our support group/programs.    Follow up instructions:    -  Return to cancer center in 5 months for f/u with Dr. Lindi Adie  -Mammogram due in 08/2019 -Follow up with surgery in 05/2019 -She is welcome to return back to the Survivorship Clinic at any time; no additional follow-up needed at this time.  -Consider referral back to survivorship as a long-term survivor for continued surveillance  The patient was provided an opportunity to ask questions and all were answered. The patient agreed with the plan and demonstrated an understanding of the instructions.   The patient was advised to call back or seek an in-person evaluation if the symptoms worsen or if the condition fails to improve as anticipated.   I provided 25 minutes of non face-to-face telephone visit time during this encounter, and > 50% was spent counseling as documented under my assessment & plan.  Scot Dock, NP

## 2019-04-14 ENCOUNTER — Inpatient Hospital Stay: Payer: Medicare Other | Attending: Adult Health | Admitting: Adult Health

## 2019-04-14 ENCOUNTER — Encounter: Payer: Self-pay | Admitting: Adult Health

## 2019-04-14 DIAGNOSIS — E2839 Other primary ovarian failure: Secondary | ICD-10-CM | POA: Diagnosis not present

## 2019-04-14 DIAGNOSIS — Z1239 Encounter for other screening for malignant neoplasm of breast: Secondary | ICD-10-CM

## 2019-04-14 DIAGNOSIS — Z170 Estrogen receptor positive status [ER+]: Secondary | ICD-10-CM

## 2019-04-14 DIAGNOSIS — C50512 Malignant neoplasm of lower-outer quadrant of left female breast: Secondary | ICD-10-CM

## 2019-04-15 ENCOUNTER — Encounter: Payer: Medicare Other | Admitting: Adult Health

## 2019-04-15 ENCOUNTER — Telehealth: Payer: Self-pay | Admitting: Hematology and Oncology

## 2019-04-15 NOTE — Telephone Encounter (Signed)
Called regarding schedule °

## 2019-07-20 DIAGNOSIS — C50512 Malignant neoplasm of lower-outer quadrant of left female breast: Secondary | ICD-10-CM | POA: Diagnosis not present

## 2019-07-20 DIAGNOSIS — Z170 Estrogen receptor positive status [ER+]: Secondary | ICD-10-CM | POA: Diagnosis not present

## 2019-07-20 DIAGNOSIS — M8589 Other specified disorders of bone density and structure, multiple sites: Secondary | ICD-10-CM | POA: Diagnosis not present

## 2019-07-20 DIAGNOSIS — Z780 Asymptomatic menopausal state: Secondary | ICD-10-CM | POA: Diagnosis not present

## 2019-07-20 DIAGNOSIS — R29890 Loss of height: Secondary | ICD-10-CM | POA: Diagnosis not present

## 2019-07-20 DIAGNOSIS — Z853 Personal history of malignant neoplasm of breast: Secondary | ICD-10-CM | POA: Diagnosis not present

## 2019-07-20 DIAGNOSIS — Z8262 Family history of osteoporosis: Secondary | ICD-10-CM | POA: Diagnosis not present

## 2019-07-22 DIAGNOSIS — C50512 Malignant neoplasm of lower-outer quadrant of left female breast: Secondary | ICD-10-CM | POA: Diagnosis not present

## 2019-07-22 DIAGNOSIS — Z170 Estrogen receptor positive status [ER+]: Secondary | ICD-10-CM | POA: Diagnosis not present

## 2019-07-27 DIAGNOSIS — Z170 Estrogen receptor positive status [ER+]: Secondary | ICD-10-CM | POA: Diagnosis not present

## 2019-07-27 DIAGNOSIS — C50512 Malignant neoplasm of lower-outer quadrant of left female breast: Secondary | ICD-10-CM | POA: Diagnosis not present

## 2019-08-10 DIAGNOSIS — Z170 Estrogen receptor positive status [ER+]: Secondary | ICD-10-CM | POA: Diagnosis not present

## 2019-08-10 DIAGNOSIS — C50512 Malignant neoplasm of lower-outer quadrant of left female breast: Secondary | ICD-10-CM | POA: Diagnosis not present

## 2019-08-25 ENCOUNTER — Emergency Department (HOSPITAL_COMMUNITY): Payer: Medicare Other

## 2019-08-25 ENCOUNTER — Emergency Department (HOSPITAL_COMMUNITY)
Admission: EM | Admit: 2019-08-25 | Discharge: 2019-08-25 | Disposition: A | Discharge status: Home / Self Care | Payer: Medicare Other | Attending: Emergency Medicine | Admitting: Emergency Medicine

## 2019-08-25 ENCOUNTER — Encounter (HOSPITAL_COMMUNITY): Payer: Self-pay | Admitting: Emergency Medicine

## 2019-08-25 DIAGNOSIS — Z853 Personal history of malignant neoplasm of breast: Secondary | ICD-10-CM | POA: Insufficient documentation

## 2019-08-25 DIAGNOSIS — Z9012 Acquired absence of left breast and nipple: Secondary | ICD-10-CM | POA: Insufficient documentation

## 2019-08-25 DIAGNOSIS — I10 Essential (primary) hypertension: Secondary | ICD-10-CM | POA: Insufficient documentation

## 2019-08-25 DIAGNOSIS — R000 Tachycardia, unspecified: Secondary | ICD-10-CM | POA: Diagnosis not present

## 2019-08-25 DIAGNOSIS — I499 Cardiac arrhythmia, unspecified: Secondary | ICD-10-CM | POA: Diagnosis not present

## 2019-08-25 DIAGNOSIS — I4891 Unspecified atrial fibrillation: Secondary | ICD-10-CM | POA: Diagnosis not present

## 2019-08-25 DIAGNOSIS — I480 Paroxysmal atrial fibrillation: Secondary | ICD-10-CM | POA: Insufficient documentation

## 2019-08-25 DIAGNOSIS — R52 Pain, unspecified: Secondary | ICD-10-CM | POA: Diagnosis not present

## 2019-08-25 DIAGNOSIS — Z79899 Other long term (current) drug therapy: Secondary | ICD-10-CM | POA: Diagnosis not present

## 2019-08-25 DIAGNOSIS — N390 Urinary tract infection, site not specified: Secondary | ICD-10-CM | POA: Diagnosis not present

## 2019-08-25 DIAGNOSIS — R002 Palpitations: Secondary | ICD-10-CM | POA: Diagnosis present

## 2019-08-25 DIAGNOSIS — R0902 Hypoxemia: Secondary | ICD-10-CM | POA: Diagnosis not present

## 2019-08-25 LAB — URINALYSIS, ROUTINE W REFLEX MICROSCOPIC
Bilirubin Urine: NEGATIVE
Glucose, UA: NEGATIVE mg/dL
Hgb urine dipstick: NEGATIVE
Ketones, ur: NEGATIVE mg/dL
Nitrite: NEGATIVE
Protein, ur: NEGATIVE mg/dL
Specific Gravity, Urine: 1.01 (ref 1.005–1.030)
pH: 5 (ref 5.0–8.0)

## 2019-08-25 LAB — CBC WITH DIFFERENTIAL/PLATELET
Abs Immature Granulocytes: 0.05 10*3/uL (ref 0.00–0.07)
Basophils Absolute: 0 10*3/uL (ref 0.0–0.1)
Basophils Relative: 0 %
Eosinophils Absolute: 0.6 10*3/uL — ABNORMAL HIGH (ref 0.0–0.5)
Eosinophils Relative: 5 %
HCT: 38.5 % (ref 36.0–46.0)
Hemoglobin: 12.6 g/dL (ref 12.0–15.0)
Immature Granulocytes: 0 %
Lymphocytes Relative: 17 %
Lymphs Abs: 1.9 10*3/uL (ref 0.7–4.0)
MCH: 29.3 pg (ref 26.0–34.0)
MCHC: 32.7 g/dL (ref 30.0–36.0)
MCV: 89.5 fL (ref 80.0–100.0)
Monocytes Absolute: 0.9 10*3/uL (ref 0.1–1.0)
Monocytes Relative: 8 %
Neutro Abs: 7.7 10*3/uL (ref 1.7–7.7)
Neutrophils Relative %: 70 %
Platelets: 330 10*3/uL (ref 150–400)
RBC: 4.3 MIL/uL (ref 3.87–5.11)
RDW: 13.2 % (ref 11.5–15.5)
WBC: 11.2 10*3/uL — ABNORMAL HIGH (ref 4.0–10.5)
nRBC: 0 % (ref 0.0–0.2)

## 2019-08-25 LAB — BASIC METABOLIC PANEL
Anion gap: 11 (ref 5–15)
BUN: 12 mg/dL (ref 8–23)
CO2: 21 mmol/L — ABNORMAL LOW (ref 22–32)
Calcium: 9.4 mg/dL (ref 8.9–10.3)
Chloride: 105 mmol/L (ref 98–111)
Creatinine, Ser: 0.82 mg/dL (ref 0.44–1.00)
GFR calc Af Amer: >60 mL/min (ref >60)
GFR calc non Af Amer: >60 mL/min (ref >60)
Glucose, Bld: 99 mg/dL (ref 70–99)
Potassium: 4.1 mmol/L (ref 3.5–5.1)
Sodium: 137 mmol/L (ref 135–145)

## 2019-08-25 LAB — TROPONIN I (HIGH SENSITIVITY)
Troponin I (High Sensitivity): 13 ng/L (ref <18)
Troponin I (High Sensitivity): 11 ng/L (ref <18)

## 2019-08-25 LAB — MAGNESIUM: Magnesium: 2.1 mg/dL (ref 1.7–2.4)

## 2019-08-25 MED ORDER — CEPHALEXIN 250 MG PO CAPS
500.0000 mg | ORAL_CAPSULE | Freq: Once | ORAL | Status: AC
  Administered 2019-08-25: 500 mg via ORAL
  Filled 2019-08-25: qty 2

## 2019-08-25 MED ORDER — CEPHALEXIN 500 MG PO CAPS: 500.0000 mg | ORAL_CAPSULE | Freq: Two times a day (BID) | ORAL | 0 refills | Status: DC

## 2019-08-25 MED ORDER — APIXABAN 5 MG PO TABS: 5.0000 mg | ORAL_TABLET | Freq: Two times a day (BID) | ORAL | 0 refills | Status: DC

## 2019-08-25 NOTE — ED Triage Notes (Signed)
[place] [facility] [place] c/o afib 150's , ems arrived and gave 5 mg lopressor pt converted back to her SR , pt arrived in Sr no complaints at present

## 2019-08-25 NOTE — ED Provider Notes (Signed)
[facility] EMERGENCY DEPARTMENT Provider Note   CSN: [id number] Arrival date & time: 08/25/19  1700     History   Chief Complaint Chief Complaint  Patient presents with  . Atrial Fibrillation    HPI Erin Gillespie is a 78 y.o. female.     The history is provided by the patient and medical records. No language interpreter was used.  Atrial Fibrillation   Erin Gillespie is a 78 y.o. female who presents to the Emergency Department complaining of irregluar heart beat.  She presents to the ED by EMS complaining of jumping feeling in her chest/heart.  She had an episode all day on Sunday.  Sxs were completely resolved Monday and Tuesday.  Sxs restarted today and she went to the fire department and EMS was called.  Three weeks ago she felt poorly with sweats and developed a skin infection at her breast cancer surgery site.  She was treated with a course of antibiotics, which was completed one week ago.  Denies fever, CP, SOB, N/V/D.  She has a hx/o HTN, HPL.  Denies tobacco use.   Past Medical History:  Diagnosis Date  . Anxiety   . Breast cancer ([place])   . Cancer ([place])   . Heart murmur    echo confirmed over 5+ years ago.   Marland Kitchen Hypercholesterolemia   . Hypertension     Patient Active Problem List   Diagnosis Date Noted  . Cancer of left female breast  ([place]) 10/12/2018  . Malignant neoplasm of lower-outer quadrant of left breast of female, estrogen receptor positive ([place]) 11/21/2009    Past Surgical History:  Procedure Laterality Date  . CATARACT EXTRACTION, BILATERAL Bilateral 2003   approx 2003  . CHOLECYSTECTOMY    . CHOLECYSTECTOMY, LAPAROSCOPIC  1990  . SIMPLE MASTECTOMY WITH AXILLARY SENTINEL NODE BIOPSY Left 10/12/2018   Procedure: LEFT  MASTECTOMY WITH  SENTINEL NODE BIOPSY;  Surgeon: Jovita Kussmaul, MD;  Location: [facility];  Service: General;  Laterality: Left;     OB History   No obstetric history on file.      Home Medications    Prior to  Admission medications   Medication Sig Start Date End Date Taking? Authorizing Provider  ALPRAZolam Duanne Moron) 0.5 MG tablet Take 0.5 mg by mouth at bedtime as needed. 1/2-1  tablet BID as need for anxiety/sleep    [provider]  apixaban (ELIQUIS) 5 MG TABS tablet Take 1 tablet (5 mg total) by mouth 2 (two) times daily. 08/25/19 09/24/19  Quintella Reichert, MD  cephALEXin (KEFLEX) 500 MG capsule Take 1 capsule (500 mg total) by mouth 2 (two) times daily. 08/25/19   Quintella Reichert, MD  cholecalciferol (VITAMIN D) 1000 units tablet Take 1,000 Units by mouth daily with lunch.    [provider]  diltiazem (TIAZAC) 300 MG 24 hr capsule Take 300 mg by mouth daily.    [provider]  letrozole (FEMARA) 2.5 MG tablet Take 1 tablet (2.5 mg total) by mouth daily. 10/30/18   Nicholas Lose, MD  methocarbamol (ROBAXIN) 500 MG tablet Take 1 tablet (500 mg total) by mouth every 6 (six) hours as needed for muscle spasms. 10/13/18   Jovita Kussmaul, MD  Multiple Vitamin (MULTIVITAMIN WITH MINERALS) TABS tablet Take 1 tablet by mouth daily with lunch.    [provider]  Omega-3 Fatty Acids (FISH OIL) 1000 MG CAPS Take 1,000 mg by mouth daily with lunch.    [provider]  pravastatin (PRAVACHOL) 40 MG tablet Take 40 mg by mouth every evening.     [provider]  spironolactone (ALDACTONE) 25 MG tablet Take 25 mg by mouth 2 (two) times daily.     [provider]    Family History Family History  Problem Relation Age of Onset  . Cancer Brother     Social History Social History   Tobacco Use  . Smoking status: Never Smoker  . Smokeless tobacco: Never Used  Substance Use Topics  . Alcohol use: Yes    Alcohol/week: 2.0 standard drinks    Types: 2 Shots of liquor per week    Comment: occasional   . Drug use: No     Allergies   Codeine and Sulfa antibiotics   Review of Systems Review of Systems  All other systems reviewed and are  negative.    Physical Exam Updated Vital Signs BP (!) 110/55   Pulse (!) 50   Resp 16   SpO2 96%   Physical Exam Vitals signs and nursing note reviewed.  Constitutional:      Appearance: She is well-developed.  HENT:     Head: Normocephalic and atraumatic.  Cardiovascular:     Rate and Rhythm: Normal rate and regular rhythm.     Heart sounds: No murmur.  Pulmonary:     Effort: Pulmonary effort is normal. No respiratory distress.     Breath sounds: Normal breath sounds.  Abdominal:     Palpations: Abdomen is soft.     Tenderness: There is no abdominal tenderness. There is no guarding or rebound.  Musculoskeletal:        General: No swelling or tenderness.  Skin:    General: Skin is warm and dry.  Neurological:     Mental Status: She is alert and oriented to person, place, and time.  Psychiatric:        Behavior: Behavior normal.      ED Treatments / Results  Labs (all labs ordered are listed, but only abnormal results are displayed) Labs Reviewed  BASIC METABOLIC PANEL - Abnormal; Notable for the following components:      Result Value   CO2 21 (*)    All other components within normal limits  CBC WITH DIFFERENTIAL/PLATELET - Abnormal; Notable for the following components:   WBC 11.2 (*)    Eosinophils Absolute 0.6 (*)    All other components within normal limits  URINALYSIS, ROUTINE W REFLEX MICROSCOPIC - Abnormal; Notable for the following components:   Leukocytes,Ua MODERATE (*)    Bacteria, UA RARE (*)    All other components within normal limits  URINE CULTURE  MAGNESIUM  TROPONIN I (HIGH SENSITIVITY)  TROPONIN I (HIGH SENSITIVITY)    EKG None    Radiology Dg Chest 2 View  Result Date: 08/25/2019 CLINICAL DATA:  78 year old female with hypertension and atrial fibrillation EXAM: CHEST - 2 VIEW COMPARISON:  Prior chest x-ray 05/25/2015 FINDINGS: The lungs are clear and negative for focal airspace consolidation, pulmonary edema or suspicious  pulmonary nodule. No pleural effusion or pneumothorax. Cardiac and mediastinal contours are within normal limits. Minimal atherosclerotic calcifications in the transverse aorta. No acute fracture or lytic or blastic osseous lesions. The visualized upper abdominal bowel gas pattern is unremarkable. Surgical clips project over the left upper breast or axilla. IMPRESSION: No active cardiopulmonary disease. Electronically Signed   By: Jacqulynn Cadet M.D.   On: 08/25/2019 18:19    Procedures Procedures (including critical care time)  Medications Ordered  in ED Medications  cephALEXin (KEFLEX) capsule 500 mg (has no administration in time range)     Initial Impression / Assessment and Plan / ED Course  I have reviewed the triage vital signs and the nursing notes.  Pertinent labs & imaging results that were available during my care of the patient were reviewed by me and considered in my medical decision making (see chart for details).         Patient with history of breast cancer, hypertension here for evaluation of palpitations that occurred today. She was in a fib with RVR on EMS arrival. She converted to a sinus rhythm after 5 mg Lopressor dose.  See attached prehospital strip in chart as well as additional strips located under the media tab.   She is asymptomatic currently. On repeat assessment she states that she did have urinary frequency last night. UA is consistent with UTI, will sub culture and treat with antibiotics.  Given high Chadsvasc score counseled patient on starting anticoagulation for stroke prevention and she is in agreement with starting Eliquis.  Counseled on bleeding risks, return precautions.      This patients CHA2DS2-VASc Score and unadjusted Ischemic Stroke Rate (% per year) is equal to 4.8 % stroke rate/year from a score of 4  Above score calculated as 1 point each if present [CHF, HTN, DM, Vascular=MI/PAD/Aortic Plaque, Age if 65-74, or Female] Above score  calculated as 2 points each if present [Age > 75, or Stroke/TIA/TE]     Final Clinical Impressions(s) / ED Diagnoses   Final diagnoses:  Paroxysmal atrial fibrillation ([place])  Acute UTI (urinary tract infection)    ED Discharge Orders         Ordered    Amb Referral to AFIB Clinic     08/25/19 2026    apixaban (ELIQUIS) 5 MG TABS tablet  2 times daily     08/25/19 2027    cephALEXin (KEFLEX) 500 MG capsule  2 times daily     08/25/19 2027           Quintella Reichert, MD 08/25/19 2049

## 2019-08-27 LAB — URINE CULTURE: Culture: >=100000 — AB

## 2019-08-28 ENCOUNTER — Telehealth: Payer: Self-pay | Admitting: Emergency Medicine

## 2019-08-28 NOTE — Progress Notes (Signed)
ED Antimicrobial Stewardship Positive Culture Follow Up   Erin Gillespie is an 78 y.o. female who presented to [facility] on 08/25/2019 with a chief complaint of  Chief Complaint  Patient presents with  . Atrial Fibrillation    Recent Results (from the past 720 hour(s))  Urine culture     Status: Abnormal   Collection Time: 08/25/19  6:30 PM   Specimen: Urine, Random  Result Value Ref Range Status   Specimen Description URINE, RANDOM  Final   Special Requests   Final    NONE Performed at [facility], [address], [place], [place] [address]    Culture >=100,000 COLONIES/mL YEAST (A)  Final   Report Status 08/27/2019 FINAL  Final    [x]  Treated with cephalexin, organism resistant to prescribed antimicrobial []  Patient discharged originally without antimicrobial agent and treatment is now indicated  New antibiotic prescription: If pt with UTI symptoms, DC cephalexin and start fluconazole 200mg  PO daily x 7 days  ED Provider: Kennith Maes, PA   Parks Czajkowski, Rande Lawman 08/28/2019, 9:11 AM Clinical Pharmacist Monday - Friday phone -  [phone] Saturday - Sunday phone - [phone]

## 2019-08-28 NOTE — Telephone Encounter (Signed)
Post ED Visit - Positive Culture Follow-up: Successful Patient Follow-Up  Culture assessed and recommendations reviewed by:  []  Elenor Quinones, Pharm.D. []  Heide Guile, Pharm.D., BCPS AQ-ID []  Parks Neptune, Pharm.D., BCPS []  Alycia Rossetti, Pharm.D., BCPS []  [place], [place].D., BCPS, AAHIVP []  Legrand Como, Pharm.D., BCPS, AAHIVP [x]  Salome Arnt, PharmD, BCPS []  Johnnette Gourd, PharmD, BCPS []  Hughes Better, PharmD, BCPS []  Leeroy Cha, PharmD  Positive urine culture  [x]  Patient discharged without antimicrobial prescription and treatment is now indicated []  Organism is resistant to prescribed ED discharge antimicrobial []  Patient with positive blood cultures  Changes discussed with ED provider: Kennith Maes PA New antibiotic prescription: fluconazole 200 mg PO x seven days Called to [place] [phone] [facility])  Contacted patient, date 08/28/2019, time [place] 08/28/2019, 4:00 PM

## 2019-08-30 ENCOUNTER — Telehealth (HOSPITAL_COMMUNITY): Payer: Self-pay | Admitting: *Deleted

## 2019-08-30 NOTE — Telephone Encounter (Signed)
Pt prefers to follow up with PCP prior to making appointment in AF Clinic. Phone number to clinic given.

## 2019-09-06 DIAGNOSIS — F331 Major depressive disorder, recurrent, moderate: Secondary | ICD-10-CM | POA: Diagnosis not present

## 2019-09-06 DIAGNOSIS — Z23 Encounter for immunization: Secondary | ICD-10-CM | POA: Diagnosis not present

## 2019-09-06 DIAGNOSIS — I480 Paroxysmal atrial fibrillation: Secondary | ICD-10-CM | POA: Diagnosis not present

## 2019-09-06 DIAGNOSIS — M25562 Pain in left knee: Secondary | ICD-10-CM | POA: Diagnosis not present

## 2019-09-06 DIAGNOSIS — C50919 Malignant neoplasm of unspecified site of unspecified female breast: Secondary | ICD-10-CM | POA: Diagnosis not present

## 2019-09-06 DIAGNOSIS — M8580 Other specified disorders of bone density and structure, unspecified site: Secondary | ICD-10-CM | POA: Diagnosis not present

## 2019-09-06 DIAGNOSIS — F419 Anxiety disorder, unspecified: Secondary | ICD-10-CM | POA: Diagnosis not present

## 2019-09-06 DIAGNOSIS — N3000 Acute cystitis without hematuria: Secondary | ICD-10-CM | POA: Diagnosis not present

## 2019-09-06 DIAGNOSIS — I471 Supraventricular tachycardia: Secondary | ICD-10-CM | POA: Diagnosis not present

## 2019-09-06 DIAGNOSIS — M545 Low back pain: Secondary | ICD-10-CM | POA: Diagnosis not present

## 2019-09-06 DIAGNOSIS — E7800 Pure hypercholesterolemia, unspecified: Secondary | ICD-10-CM | POA: Diagnosis not present

## 2019-09-06 DIAGNOSIS — R609 Edema, unspecified: Secondary | ICD-10-CM | POA: Diagnosis not present

## 2019-09-06 DIAGNOSIS — I10 Essential (primary) hypertension: Secondary | ICD-10-CM | POA: Diagnosis not present

## 2019-09-07 DIAGNOSIS — I10 Essential (primary) hypertension: Secondary | ICD-10-CM | POA: Diagnosis not present

## 2019-09-07 DIAGNOSIS — N3000 Acute cystitis without hematuria: Secondary | ICD-10-CM | POA: Diagnosis not present

## 2019-09-14 ENCOUNTER — Telehealth: Payer: Self-pay | Admitting: Hematology and Oncology

## 2019-09-14 ENCOUNTER — Inpatient Hospital Stay: Payer: Medicare Other | Admitting: Hematology and Oncology

## 2019-09-14 NOTE — Telephone Encounter (Signed)
Returned patient's phone call regarding rescheduling an appointment, per patient's request 10/06 has been moved to 10/27.

## 2019-09-14 NOTE — Assessment & Plan Note (Deleted)
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 Return to clinic in 1 year for follow-up

## 2019-09-23 ENCOUNTER — Ambulatory Visit (HOSPITAL_COMMUNITY)
Admission: RE | Admit: 2019-09-23 | Discharge: 2019-09-23 | Disposition: A | Discharge status: Home / Self Care | Payer: Medicare Other | Source: Ambulatory Visit | Attending: Physician Assistant | Admitting: Physician Assistant

## 2019-09-23 ENCOUNTER — Other Ambulatory Visit: Payer: Self-pay

## 2019-09-23 VITALS — BP 116/76 | HR 152 | Ht 65.0 in | Wt 187.8 lb

## 2019-09-23 DIAGNOSIS — I480 Paroxysmal atrial fibrillation: Secondary | ICD-10-CM | POA: Insufficient documentation

## 2019-09-23 DIAGNOSIS — Z7901 Long term (current) use of anticoagulants: Secondary | ICD-10-CM | POA: Insufficient documentation

## 2019-09-23 DIAGNOSIS — Z79899 Other long term (current) drug therapy: Secondary | ICD-10-CM | POA: Diagnosis not present

## 2019-09-23 DIAGNOSIS — I10 Essential (primary) hypertension: Secondary | ICD-10-CM | POA: Insufficient documentation

## 2019-09-23 DIAGNOSIS — E7800 Pure hypercholesterolemia, unspecified: Secondary | ICD-10-CM | POA: Insufficient documentation

## 2019-09-23 DIAGNOSIS — I471 Supraventricular tachycardia: Secondary | ICD-10-CM | POA: Insufficient documentation

## 2019-09-23 DIAGNOSIS — I4892 Unspecified atrial flutter: Secondary | ICD-10-CM | POA: Diagnosis not present

## 2019-09-23 DIAGNOSIS — R9431 Abnormal electrocardiogram [ECG] [EKG]: Secondary | ICD-10-CM | POA: Insufficient documentation

## 2019-09-23 DIAGNOSIS — F419 Anxiety disorder, unspecified: Secondary | ICD-10-CM | POA: Insufficient documentation

## 2019-09-23 MED ORDER — METOPROLOL TARTRATE 25 MG PO TABS: ORAL_TABLET | ORAL | 2 refills | Status: DC

## 2019-09-23 MED ORDER — APIXABAN 5 MG PO TABS: 5.0000 mg | ORAL_TABLET | Freq: Two times a day (BID) | ORAL | 6 refills | Status: DC

## 2019-09-23 NOTE — Progress Notes (Signed)
Primary Care Physician: Maury Dus, MD Primary Cardiologist: none Primary Electrophysiologist: none Referring Physician: [facility]/Dr Davis Gourd is a 78 y.o. female with a history of HTN, breast cancer, HLD, SVT, and paroxysmal atrial fibrillation who presents for consultation in the [facility].  The patient was initially diagnosed with atrial fibrillation on 08/25/19 after presenting to the ER with symptoms of an irregular heart beat. She converted to SR after 5 mg of Lopressor and was started on Eliquis. Of note, she had just finished a course of antibiotics for a surgical skin infection. She was also incidentally found to have a UTI at the ER and started on antibiotics.  Since then, she has had heart racing off and on intermittently. She states this has been going on for quite some time. She had a 30 day event monitor in 2019 which showed SVT episodes which correlated with her symptoms. She was initially in rapid atrial flutter today but converted during her visit to [place] with PACs. She denies any other symptoms including SOB, CP, dizziness, or fatigue.   Today, she denies symptoms of chest pain, shortness of breath, orthopnea, PND, lower extremity edema, dizziness, presyncope, syncope, snoring, daytime somnolence, bleeding, or neurologic sequela. The patient is tolerating medications without difficulties and is otherwise without complaint today.    Atrial Fibrillation Risk Factors:  she does not have symptoms or diagnosis of sleep apnea. she does not have a history of alcohol use.   she has a BMI of Body mass index is 31.25 kg/m.Marland Kitchen Filed Weights   09/23/19 1146  Weight: 85.2 kg    Family History  Problem Relation Age of Onset  . Cancer Brother      Atrial Fibrillation Management history:  Previous antiarrhythmic drugs: none Previous cardioversions: none Previous ablations: none CHADS2VASC score: 4 Anticoagulation history: Eliquis    Past Medical History:  Diagnosis Date  . Anxiety   . Breast cancer ([place])   . Cancer ([place])   . Heart murmur    echo confirmed over 5+ years ago.   Marland Kitchen Hypercholesterolemia   . Hypertension    Past Surgical History:  Procedure Laterality Date  . CATARACT EXTRACTION, BILATERAL Bilateral 2003   approx 2003  . CHOLECYSTECTOMY    . CHOLECYSTECTOMY, LAPAROSCOPIC  1990  . SIMPLE MASTECTOMY WITH AXILLARY SENTINEL NODE BIOPSY Left 10/12/2018   Procedure: LEFT  MASTECTOMY WITH  SENTINEL NODE BIOPSY;  Surgeon: Jovita Kussmaul, MD;  Location: [facility];  Service: General;  Laterality: Left;    Current Outpatient Medications  Medication Sig Dispense Refill  . alendronate (FOSAMAX) 70 MG tablet TAKE 1 TABLET BY MOUTH ONCE A WEEK FOR 90 DAYS    . ALPRAZolam (XANAX) 0.5 MG tablet Take 0.5 mg by mouth at bedtime as needed. 1/2-1  tablet BID as need for anxiety/sleep    . apixaban (ELIQUIS) 5 MG TABS tablet Take 1 tablet (5 mg total) by mouth 2 (two) times daily. 60 tablet 6  . cholecalciferol (VITAMIN D) 1000 units tablet Take 1,000 Units by mouth daily with lunch.    . diltiazem (TIAZAC) 300 MG 24 hr capsule Take 300 mg by mouth daily.    . DULoxetine (CYMBALTA) 60 MG capsule TAKE 2 CAPSULES BY MOUTH IN THE EVENING ON A FULL STOMACH WITH HIGH PROTEIN CONTENT    . letrozole (FEMARA) 2.5 MG tablet Take 1 tablet (2.5 mg total) by mouth daily. 90 tablet 3  . methocarbamol (ROBAXIN)  500 MG tablet Take 1 tablet (500 mg total) by mouth every 6 (six) hours as needed for muscle spasms. 20 tablet 1  . Multiple Vitamin (MULTIVITAMIN WITH MINERALS) TABS tablet Take 1 tablet by mouth daily with lunch.    . Omega-3 Fatty Acids (FISH OIL) 1000 MG CAPS Take 1,000 mg by mouth daily with lunch.    . pravastatin (PRAVACHOL) 40 MG tablet Take 40 mg by mouth every evening.     Marland Kitchen spironolactone (ALDACTONE) 25 MG tablet Take 25 mg by mouth 2 (two) times daily.     . metoprolol tartrate (LOPRESSOR) 25 MG tablet Take 1 tablet  every 6 hours as needed for afib HR over 100 45 tablet 2   No current facility-administered medications for this encounter.     Allergies  Allergen Reactions  . Codeine Nausea And Vomiting and Rash  . Sulfa Antibiotics Rash    Social History   Socioeconomic History  . Marital status: Widowed    Spouse name: Not on file  . Number of children: Not on file  . Years of education: Not on file  . Highest education level: Not on file  Occupational History  . Not on file  Social Needs  . Financial resource strain: Not on file  . Food insecurity    Worry: Not on file    Inability: Not on file  . Transportation needs    Medical: Not on file    Non-medical: Not on file  Tobacco Use  . Smoking status: Never Smoker  . Smokeless tobacco: Never Used  Substance and Sexual Activity  . Alcohol use: Yes    Alcohol/week: 2.0 standard drinks    Types: 2 Shots of liquor per week    Comment: occasional   . Drug use: No  . Sexual activity: Not Currently  Lifestyle  . Physical activity    Days per week: Not on file    Minutes per session: Not on file  . Stress: Not on file  Relationships  . Social Herbalist on phone: Not on file    Gets together: Not on file    Attends religious service: Not on file    Active member of club or organization: Not on file    Attends meetings of clubs or organizations: Not on file    Relationship status: Not on file  . Intimate partner violence    Fear of current or ex partner: Not on file    Emotionally abused: Not on file    Physically abused: Not on file    Forced sexual activity: Not on file  Other Topics Concern  . Not on file  Social History Narrative  . Not on file     ROS- All systems are reviewed and negative except as per the HPI above.  Physical Exam: Vitals:   09/23/19 1146  BP: 116/76  Pulse: (!) 152  Weight: 85.2 kg  Height: 5\' 5"  (1.651 m)    GEN- The patient is well appearing obese female, alert and oriented x  3 today.   Head- normocephalic, atraumatic Eyes-  Sclera clear, conjunctiva pink Ears- hearing intact Oropharynx- clear Neck- supple  Lungs- Clear to ausculation bilaterally, normal work of breathing Heart- Regular rate and rhythm, no murmurs, rubs or gallops  GI- soft, NT, ND, + BS Extremities- no clubbing, cyanosis, or edema MS- no significant deformity or atrophy Skin- no rash or lesion Psych- euthymic mood, full affect Neuro- strength and sensation are intact  Wt Readings from Last 3 Encounters:  09/23/19 85.2 kg  10/12/18 81.6 kg  10/09/18 85 kg    EKG today demonstrates SR HR 91, PACs, minimal ST elevation inferior (noted on previous ECG at ER), 1st degree AV block, PR 266, QRS 78, QTc 442  Epic records are reviewed at length today  Labs from PCP 09/06/19 WBC 9.0, Hgb 12.4, Hct 36.3, PLT 391 Cr 0.79, BUN 14, K+ 4.7, AST 13, ALT 10  Assessment and Plan:  1. Paroxysmal atrial fibrillation/atrial flutter/SVT General education about afib/flutter provided and questions answered. We also discussed her stroke risk and the risks and benefits of anticoagulation. Patient converted to SR during office visit.  Will start Lopressor 25 mg PRN q6hrs for heart racing. Continue diltiazem 300 mg daily. We did discuss ablation as a possible option given her symptomatic SVT and typical appearing atrial flutter. Will refer to EP. Check echocardiogram. Continue Eliquis 5 mg BID (started 09/06/19) Labs from PCP reviewed.   This patients CHA2DS2-VASc Score and unadjusted Ischemic Stroke Rate (% per year) is equal to 4.8 % stroke rate/year from a score of 4  Above score calculated as 1 point each if present [CHF, HTN, DM, Vascular=MI/PAD/Aortic Plaque, Age if 65-74, or Female] Above score calculated as 2 points each if present [Age > 75, or Stroke/TIA/TE]   2. Obesity Body mass index is 31.25 kg/m. Lifestyle modification was discussed at length including regular exercise and weight  reduction.  3. Abnormal ECG ST elevation noted, similar to ECG one month ago in ER. Troponin negative at that time.  Patient denies any chest pain, shortness of breath, nausea, or diaphoresis.  Will check echocardiogram for WMA.  4. HTN Stable, no changes today.    Follow up with EP.    [facility] [address] [place], [place] [address] [phone] 09/23/2019 2:36 PM

## 2019-09-23 NOTE — Patient Instructions (Signed)
Metoprolol 25mg  every 6 hours as needed for afib HR over 100  Dr Curt Bears office will be in touch to set up follow up appointment.

## 2019-09-30 DIAGNOSIS — Z170 Estrogen receptor positive status [ER+]: Secondary | ICD-10-CM | POA: Diagnosis not present

## 2019-09-30 DIAGNOSIS — C50512 Malignant neoplasm of lower-outer quadrant of left female breast: Secondary | ICD-10-CM | POA: Diagnosis not present

## 2019-10-04 NOTE — Progress Notes (Signed)
Patient Care Team: Maury Dus, MD as PCP - General (Family Medicine) Nicholas Lose, MD as Consulting Physician (Hematology and Oncology) Jovita Kussmaul, MD as Consulting Physician (General Surgery)  DIAGNOSIS:    ICD-10-CM   1. 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 status post lumpectomy 01/01/2010, ER 99%, PR 100% status post radiation therapy on 03/05/2010 to 04/30/2010, tamoxifen 20 mg daily from June 2011 - June 2016   09/07/2018 Relapse/Recurrence   Screening mammogram detected 6 mm mass at 4 o'clock position relapse in the left breast 4 cm from the nipple grade 2 IDC ER 80%, PR 10%, HER-2 negative IHC 2+ and fish ratio 1.23, Ki-67 2% T1b N0 stage Ia   10/12/2018 Surgery   Left mastectomy: IDC grade 2, 1.2 cm, high-grade DCIS, margins negative, 0/5 lymph nodes negative, ER 80%, PR 10%, HER-2 negative, Ki-67 2% T1CN0 stage Ia    11/2018 -  Anti-estrogen oral therapy   Letrozole daily     CHIEF COMPLIANT: Follow-up of recurrent left breast cancer on letrozole  INTERVAL HISTORY: Erin Gillespie is a 78 y.o. with above-mentioned history of recurrent left breast cancer who underwent a left mastectomy and is currently on anti-estrogen therapy with letrozole. She presents to the clinic today for follow-up.  Patient was diagnosed with atrial fibrillation and is currently on blood thinners and cardiac medications.  She tells me that she has had so many appointments that she is fatigued of seeing doctors.  REVIEW OF SYSTEMS:   Constitutional: Denies fevers, chills or abnormal weight loss Eyes: Denies blurriness of vision Ears, nose, mouth, throat, and face: Denies  mucositis or sore throat Respiratory: Denies cough, dyspnea or wheezes Cardiovascular: Denies palpitation, chest discomfort Gastrointestinal: Denies nausea, heartburn or change in bowel habits Skin: Denies abnormal skin rashes Lymphatics: Denies new lymphadenopathy or easy bruising Neurological: Denies numbness, tingling or new weaknesses Behavioral/Psych: Mood is stable, no new changes  Extremities: No lower extremity edema Breast: denies any pain or lumps or nodules in either breasts All other systems were reviewed with the patient and are negative.  I have reviewed the past medical history, past surgical history, social history and family history with the patient and they are unchanged from previous note.  ALLERGIES:  is allergic to codeine and sulfa antibiotics.  MEDICATIONS:  Current Outpatient Medications  Medication Sig Dispense Refill  . alendronate (FOSAMAX) 70 MG tablet TAKE 1 TABLET BY MOUTH ONCE A WEEK FOR 90 DAYS    . ALPRAZolam (XANAX) 0.5 MG tablet Take 0.5 mg by mouth at bedtime as needed. 1/2-1  tablet BID as need for anxiety/sleep    . apixaban (ELIQUIS) 5 MG TABS tablet Take 1 tablet (5 mg total) by mouth 2 (two) times daily. 60 tablet 6  . cholecalciferol (VITAMIN D) 1000 units tablet Take 1,000 Units by mouth daily with lunch.    . diltiazem (TIAZAC) 300 MG 24 hr capsule Take 300 mg by mouth daily.    . DULoxetine (CYMBALTA) 60 MG capsule TAKE 2 CAPSULES BY MOUTH IN THE EVENING ON A FULL STOMACH WITH HIGH PROTEIN CONTENT    . letrozole (FEMARA) 2.5 MG tablet Take 1 tablet (2.5 mg total) by mouth  daily. 90 tablet 3  . methocarbamol (ROBAXIN) 500 MG tablet Take 1 tablet (500 mg total) by mouth every 6 (six) hours as needed for muscle spasms. 20 tablet 1  . metoprolol tartrate (LOPRESSOR) 25 MG tablet Take 1 tablet every 6 hours as needed for afib HR over 100 45 tablet 2  . Multiple Vitamin (MULTIVITAMIN WITH MINERALS) TABS tablet Take 1 tablet by mouth daily with lunch.     . Omega-3 Fatty Acids (FISH OIL) 1000 MG CAPS Take 1,000 mg by mouth daily with lunch.    . pravastatin (PRAVACHOL) 40 MG tablet Take 40 mg by mouth every evening.     Marland Kitchen spironolactone (ALDACTONE) 25 MG tablet Take 25 mg by mouth 2 (two) times daily.      No current facility-administered medications for this visit.     PHYSICAL EXAMINATION: ECOG PERFORMANCE STATUS: 1 - Symptomatic but completely ambulatory  Vitals:   10/05/19 1156  BP: 137/81  Pulse: 95  Resp: 18  Temp: 98.2 F (36.8 C)  SpO2: 95%   Filed Weights   10/05/19 1156  Weight: 185 lb 8 oz (84.1 kg)    GENERAL: alert, no distress and comfortable SKIN: skin color, texture, turgor are normal, no rashes or significant lesions EYES: normal, Conjunctiva are pink and non-injected, sclera clear OROPHARYNX: no exudate, no erythema and lips, buccal mucosa, and tongue normal  NECK: supple, thyroid normal size, non-tender, without nodularity LYMPH: no palpable lymphadenopathy in the cervical, axillary or inguinal LUNGS: clear to auscultation and percussion with normal breathing effort HEART: regular rate & rhythm and no murmurs and no lower extremity edema ABDOMEN: abdomen soft, non-tender and normal bowel sounds MUSCULOSKELETAL: no cyanosis of digits and no clubbing  NEURO: alert & oriented x 3 with fluent speech, no focal motor/sensory deficits EXTREMITIES: No lower extremity edema    LABORATORY DATA:  I have reviewed the data as listed CMP Latest Ref Rng & Units 08/25/2019 10/09/2018 05/25/2015  Glucose 70 - 99 mg/dL 99 103(H) 145(H)  BUN 8 - 23 mg/dL _0 Creatinine 0.44 - 1.00 mg/dL 0.82 0.93 0.96  Sodium 135 - 145 mmol/L 137 137 136  Potassium 3.5 - 5.1 mmol/L 4.1 3.7 3.9  Chloride 98 - 111 mmol/L 105 105 99(L)  CO2 22 - 32 mmol/L 21(L) 27 25  Calcium 8.9 - 10.3 mg/dL 9.4 9.4 9.0  Total Protein 6.5 - 8.1 g/dL - - 7.5  Total Bilirubin 0.3 - 1.2 mg/dL - - 0.8  Alkaline Phos 38 - 126 U/L - - 54  AST 15 - 41  U/L - - 19  ALT 14 - 54 U/L - - 14    Lab Results  Component Value Date   WBC 11.2 (H) 08/25/2019   HGB 12.6 08/25/2019   HCT 38.5 08/25/2019   MCV 89.5 08/25/2019   PLT 330 08/25/2019   NEUTROABS 7.7 08/25/2019    ASSESSMENT & PLAN:  Malignant neoplasm of lower-outer quadrant of left breast of female, estrogen receptor positive ([place]) 10/12/2018: Left mastectomy: IDC grade 2, 1.2 cm, high-grade DCIS, margins negative, 0/5 lymph nodes negative, ER 80%, PR 10%, HER-2 negative, Ki-67 2% T1CN0 stage Ia  (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 New onset atrial fibrillation: Today she is in sinus rhythm.    Breast cancer surveillance: 1.  Breast exam 10/05/2019: Benign 2.  Mammogram at [facility] benign  Return to clinic in 1 year for follow-up    No orders of the defined types were placed in this encounter.  The patient has a good understanding of the overall plan. she agrees with it. she will call with any problems that may develop before the next visit here.  Gudena, Vinay, MD 10/05/2019  I, Molly Dorshimer am acting as scribe for Dr. Vinay Gudena.  I have reviewed the above documentation for accuracy and completeness, and I agree with the above.       

## 2019-10-05 ENCOUNTER — Inpatient Hospital Stay: Payer: Medicare Other | Attending: Hematology and Oncology | Admitting: Hematology and Oncology

## 2019-10-05 ENCOUNTER — Other Ambulatory Visit: Payer: Self-pay

## 2019-10-05 DIAGNOSIS — Z170 Estrogen receptor positive status [ER+]: Secondary | ICD-10-CM | POA: Insufficient documentation

## 2019-10-05 DIAGNOSIS — Z79811 Long term (current) use of aromatase inhibitors: Secondary | ICD-10-CM | POA: Insufficient documentation

## 2019-10-05 DIAGNOSIS — Z7901 Long term (current) use of anticoagulants: Secondary | ICD-10-CM | POA: Diagnosis not present

## 2019-10-05 DIAGNOSIS — C50512 Malignant neoplasm of lower-outer quadrant of left female breast: Secondary | ICD-10-CM | POA: Insufficient documentation

## 2019-10-05 DIAGNOSIS — I4891 Unspecified atrial fibrillation: Secondary | ICD-10-CM | POA: Diagnosis not present

## 2019-10-05 MED ORDER — LETROZOLE 2.5 MG PO TABS: 2.5000 mg | ORAL_TABLET | Freq: Every day | ORAL | 3 refills | Status: DC

## 2019-10-05 NOTE — Assessment & Plan Note (Signed)
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  Breast exam 10/05/2019: Benign 2.  Mammogram at [facility]  Return to clinic in 1 year for follow-up

## 2019-10-06 ENCOUNTER — Ambulatory Visit (HOSPITAL_COMMUNITY): Payer: Medicare Other

## 2019-10-06 ENCOUNTER — Telehealth: Payer: Self-pay | Admitting: Hematology and Oncology

## 2019-10-06 NOTE — Telephone Encounter (Signed)
I talk with patient regarding schedule  

## 2019-10-08 DIAGNOSIS — C50919 Malignant neoplasm of unspecified site of unspecified female breast: Secondary | ICD-10-CM | POA: Diagnosis not present

## 2019-10-08 DIAGNOSIS — E7800 Pure hypercholesterolemia, unspecified: Secondary | ICD-10-CM | POA: Diagnosis not present

## 2019-10-08 DIAGNOSIS — D0510 Intraductal carcinoma in situ of unspecified breast: Secondary | ICD-10-CM | POA: Diagnosis not present

## 2019-10-08 DIAGNOSIS — I480 Paroxysmal atrial fibrillation: Secondary | ICD-10-CM | POA: Diagnosis not present

## 2019-10-08 DIAGNOSIS — F324 Major depressive disorder, single episode, in partial remission: Secondary | ICD-10-CM | POA: Diagnosis not present

## 2019-10-08 DIAGNOSIS — Z853 Personal history of malignant neoplasm of breast: Secondary | ICD-10-CM | POA: Diagnosis not present

## 2019-10-08 DIAGNOSIS — I10 Essential (primary) hypertension: Secondary | ICD-10-CM | POA: Diagnosis not present

## 2019-10-08 DIAGNOSIS — F331 Major depressive disorder, recurrent, moderate: Secondary | ICD-10-CM | POA: Diagnosis not present

## 2019-10-08 IMAGING — DX DG CHEST 2V
2 series · 2 of 2 positions shown · non-contrast
Comparison: Prior chest x-ray 05/25/2015

CLINICAL DATA: 77-year-old female with hypertension and atrial
fibrillation

EXAM:
CHEST - 2 VIEW

[chest lat]
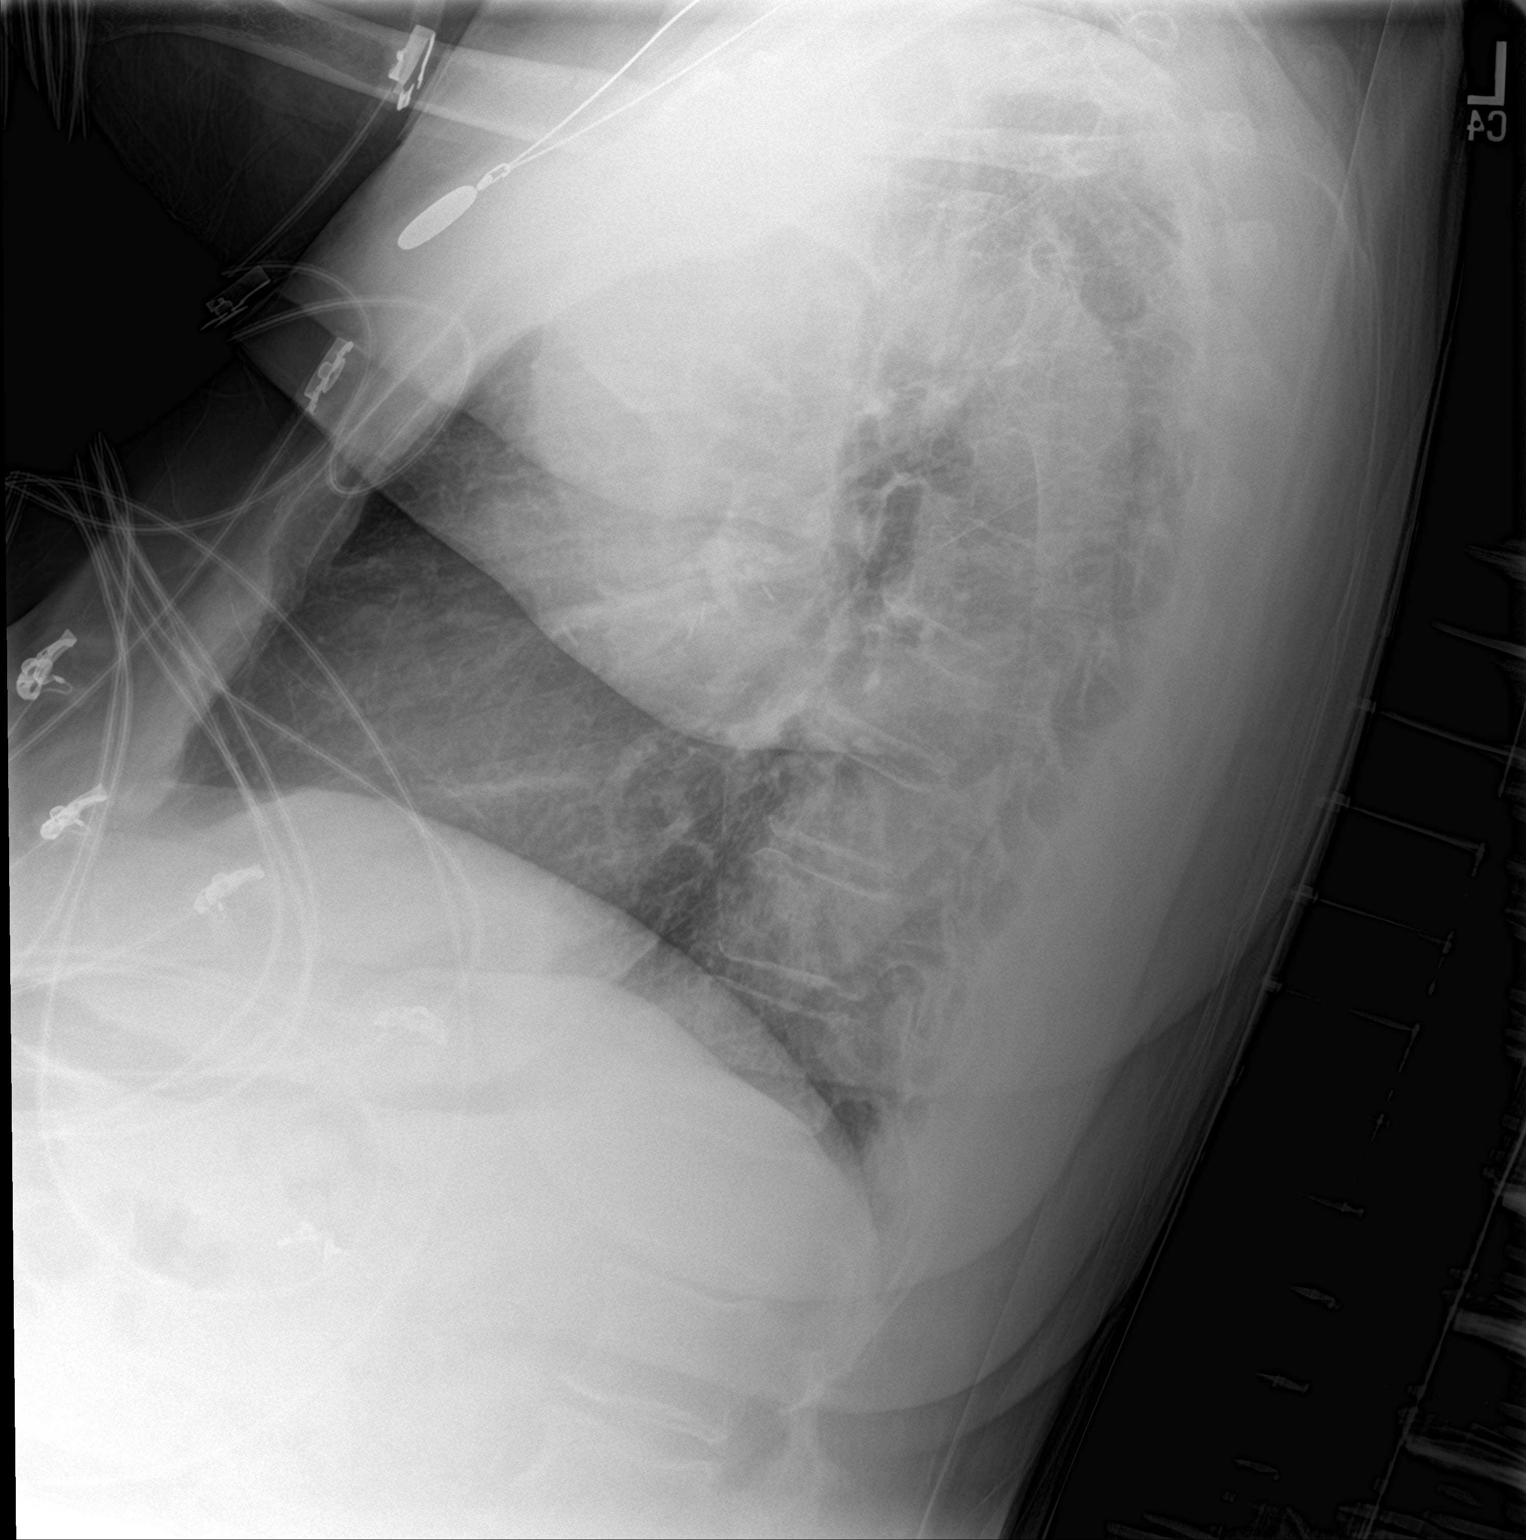

[chest ap]
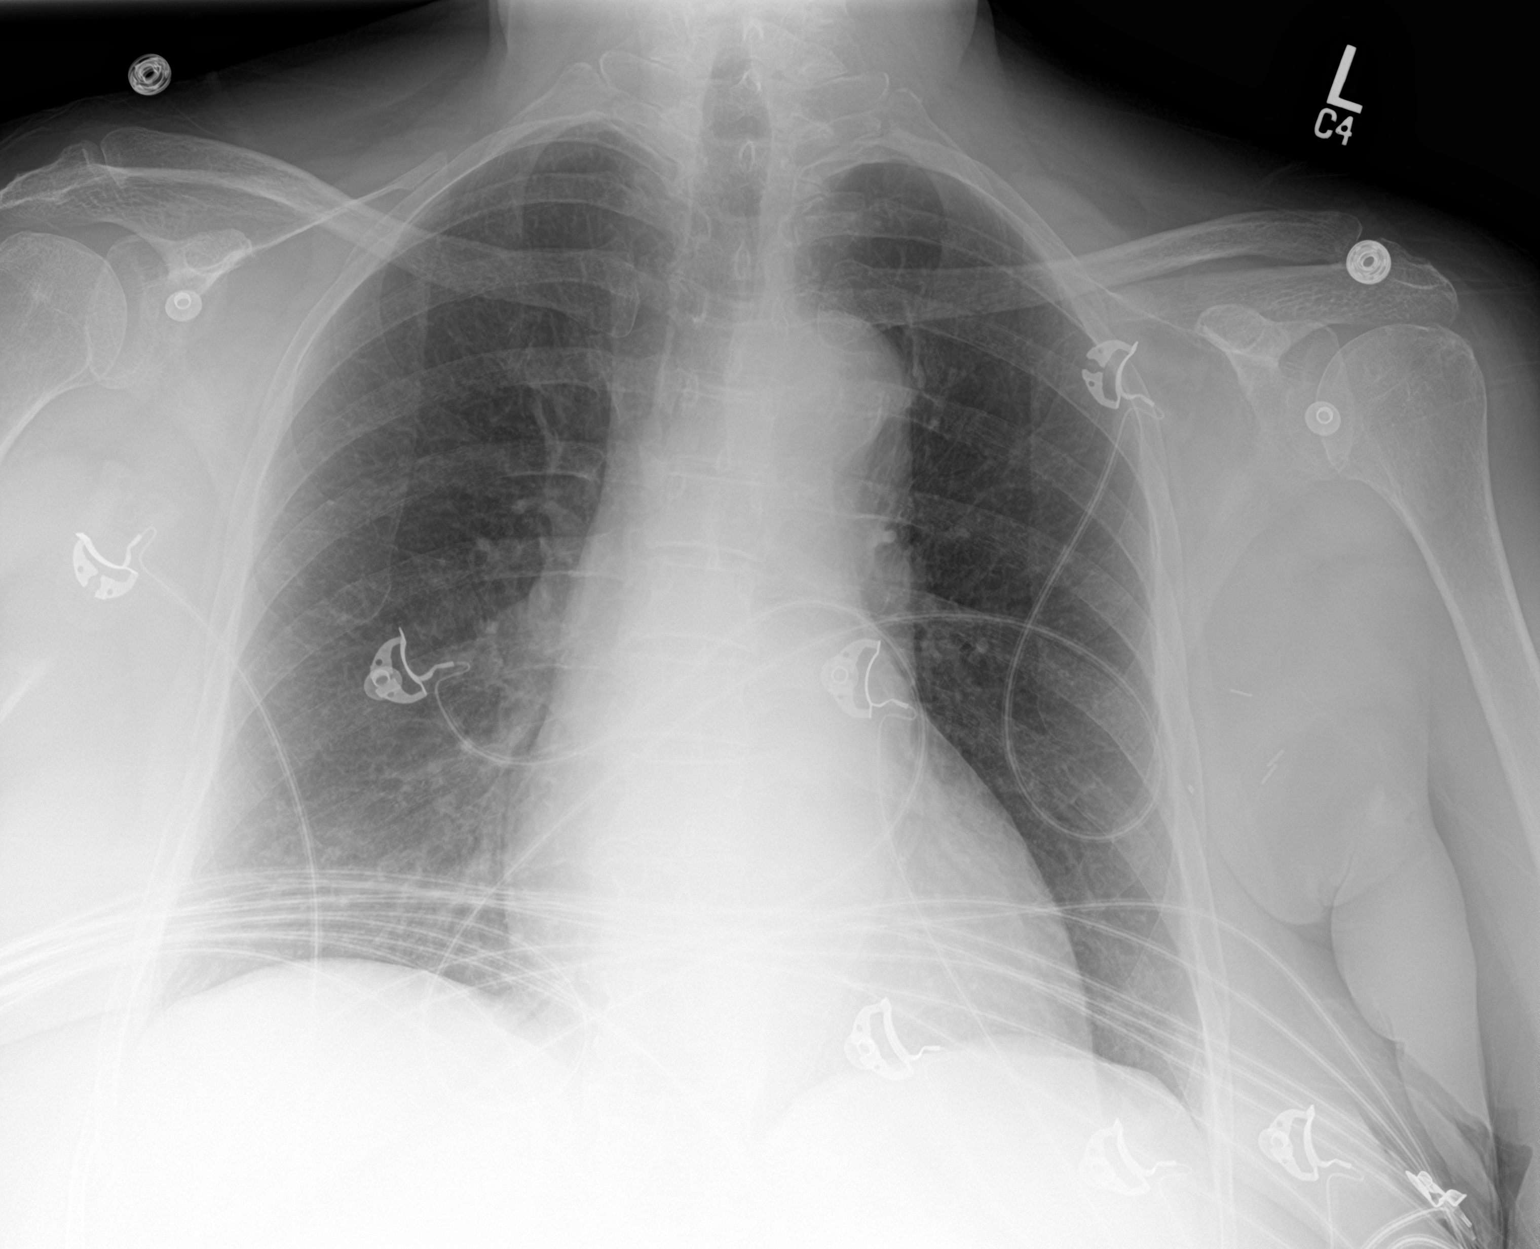

[2 of 2 positions shown; findings below may reference images not displayed]

FINDINGS: The lungs are clear and negative for focal airspace consolidation,
pulmonary edema or suspicious pulmonary nodule. No pleural effusion
or pneumothorax. Cardiac and mediastinal contours are within normal
limits. Minimal atherosclerotic calcifications in the transverse
aorta. No acute fracture or lytic or blastic osseous lesions. The
visualized upper abdominal bowel gas pattern is unremarkable.
Surgical clips project over the left upper breast or axilla.
IMPRESSION: No active cardiopulmonary disease.

## 2019-10-13 ENCOUNTER — Ambulatory Visit (HOSPITAL_COMMUNITY): Payer: Medicare Other

## 2019-10-21 ENCOUNTER — Institutional Professional Consult (permissible substitution): Payer: Medicare Other | Admitting: Cardiology

## 2020-05-18 ENCOUNTER — Other Ambulatory Visit (HOSPITAL_COMMUNITY): Payer: Self-pay | Admitting: Physician Assistant

## 2020-06-23 ENCOUNTER — Other Ambulatory Visit (HOSPITAL_COMMUNITY): Payer: Self-pay | Admitting: Physician Assistant

## 2020-06-26 ENCOUNTER — Telehealth: Payer: Self-pay | Admitting: Cardiology

## 2020-06-26 DIAGNOSIS — I4892 Unspecified atrial flutter: Secondary | ICD-10-CM

## 2020-06-26 DIAGNOSIS — I480 Paroxysmal atrial fibrillation: Secondary | ICD-10-CM

## 2020-06-26 NOTE — Telephone Encounter (Signed)
Follow Up:    I went in pt's chart to make sure it was routed. The note had said insomplete. The note was seen by Julaine Hua and routed to [employer] and [employer]

## 2020-06-26 NOTE — Telephone Encounter (Signed)
I am going to route this call to both A-fib clinic as well as Dr. Curt Bears nurse Sherri to follow up.

## 2020-06-26 NOTE — Telephone Encounter (Signed)
Pt last saw Clint Fenton, PA on 09/23/19, last labs 09/07/19 Creat 0.79, age 79, weight 84.1kg, based on specified criteria pt is on appropriate dosage of Eliquis 5mg  BID.  Pt needs follow-up with EP MD per last OV note on 09/23/19. Will send message to scheduler to schedule follow-up appt. Will refill x 1 month supply and await appt to be scheduled with MD.

## 2020-06-26 NOTE — Telephone Encounter (Signed)
New Message:     Is pt supposed to see Dr Curt Bears? Ronney Asters says notes in Epic said pt need to follw up with Dr Camnitz/ I thought Dr Curt Bears  Did not need to see her and she was to follow up with primary doctor.

## 2020-06-26 NOTE — Telephone Encounter (Signed)
Called pt and got pt rescheduled for consult w/ Camnitz 8/17. Pt aware I will discuss if echo needed prior to consult. Aware I will only call back if he wants testing prior to appt, otherwise will address at August OV. Pt agreeable to plan.

## 2020-06-27 ENCOUNTER — Telehealth: Payer: Self-pay | Admitting: Hematology and Oncology

## 2020-06-27 NOTE — Telephone Encounter (Signed)
Rescheduled appointment per 7/1 message. Patient is aware of updated appointment date and time.

## 2020-06-28 NOTE — Telephone Encounter (Signed)
Yes TTE would be good.

## 2020-06-29 NOTE — Telephone Encounter (Signed)
Pt made aware office would call to arrange echocardiogram testing. Pt agreeable

## 2020-07-13 ENCOUNTER — Other Ambulatory Visit: Payer: Self-pay

## 2020-07-13 ENCOUNTER — Ambulatory Visit (HOSPITAL_COMMUNITY): Payer: Medicare Other | Attending: Internal Medicine

## 2020-07-13 DIAGNOSIS — I480 Paroxysmal atrial fibrillation: Secondary | ICD-10-CM | POA: Diagnosis not present

## 2020-07-13 DIAGNOSIS — I4892 Unspecified atrial flutter: Secondary | ICD-10-CM | POA: Diagnosis present

## 2020-07-13 LAB — ECHOCARDIOGRAM COMPLETE
AV Mean grad: 8 mmHg
Area-P 1/2: 2.93 cm2
P 1/2 time: 472 msec
S' Lateral: 3.2 cm

## 2020-07-14 ENCOUNTER — Encounter (HOSPITAL_COMMUNITY): Payer: Self-pay | Admitting: *Deleted

## 2020-07-15 ENCOUNTER — Other Ambulatory Visit (HOSPITAL_COMMUNITY): Payer: Self-pay | Admitting: Physician Assistant

## 2020-07-25 ENCOUNTER — Institutional Professional Consult (permissible substitution): Payer: Medicare Other | Admitting: Cardiology

## 2020-08-22 ENCOUNTER — Ambulatory Visit (INDEPENDENT_AMBULATORY_CARE_PROVIDER_SITE_OTHER): Payer: Medicare Other | Admitting: Cardiology

## 2020-08-22 ENCOUNTER — Encounter: Payer: Self-pay | Admitting: Cardiology

## 2020-08-22 ENCOUNTER — Other Ambulatory Visit: Payer: Self-pay

## 2020-08-22 VITALS — BP 112/56 | HR 53 | Ht 65.0 in | Wt 188.0 lb

## 2020-08-22 DIAGNOSIS — I480 Paroxysmal atrial fibrillation: Secondary | ICD-10-CM | POA: Diagnosis not present

## 2020-08-22 MED ORDER — APIXABAN 5 MG PO TABS: 5.0000 mg | ORAL_TABLET | Freq: Two times a day (BID) | ORAL | 1 refills | Status: DC

## 2020-08-22 MED ORDER — METOPROLOL TARTRATE 25 MG PO TABS: ORAL_TABLET | ORAL | 1 refills | Status: DC

## 2020-08-22 NOTE — Progress Notes (Signed)
Electrophysiology Office Note   Date:  08/22/2020   ID:  Erin Gillespie, DOB July 05, 1941, MRN [id number]  PCP:  Maury Dus, MD  Cardiologist:   Primary Electrophysiologist:  Alonah Lineback Meredith Leeds, MD    Chief Complaint: AF   History of Present Illness: Erin Gillespie is a 79 y.o. female who is being seen today for the evaluation of AF at the request of Maury Dus, MD. Presenting today for electrophysiology evaluation.  She has a history of hypertension, breast cancer, hyperlipidemia, SVT, and paroxysmal atrial fibrillation.  She presented with atrial fibrillation 08/25/2019 after going to the emergency room with palpitations.  She converted after 5 mg of IV Lopressor and was started on Eliquis.  She had just finished antibiotics for a skin infection.  She was found to have a UTI in the emergency room and was started on antibiotics.  She continued to have episodes of palpitations.  She wore a 30-day monitor that showed SVT in 2019.  Today, she denies symptoms of palpitations, chest pain, shortness of breath, orthopnea, PND, lower extremity edema, claudication, dizziness, presyncope, syncope, bleeding, or neurologic sequela. The patient is tolerating medications without difficulties.  Since being seen in atrial fibrillation clinic she has done well.  She takes as needed metoprolol and is noted that she takes it maybe once a week.  She is comfortable with her overall control.  She does wish to have some dental work done, and would be able to stop her Eliquis around that time.   Past Medical History:  Diagnosis Date  . Anxiety   . Breast cancer ([place])   . Cancer ([place])   . Heart murmur    echo confirmed over 5+ years ago.   Marland Kitchen Hypercholesterolemia   . Hypertension    Past Surgical History:  Procedure Laterality Date  . CATARACT EXTRACTION, BILATERAL Bilateral 2003   approx 2003  . CHOLECYSTECTOMY    . CHOLECYSTECTOMY, LAPAROSCOPIC  1990  . SIMPLE MASTECTOMY WITH AXILLARY SENTINEL NODE  BIOPSY Left 10/12/2018   Procedure: LEFT  MASTECTOMY WITH  SENTINEL NODE BIOPSY;  Surgeon: Jovita Kussmaul, MD;  Location: [facility];  Service: General;  Laterality: Left;     Current Outpatient Medications  Medication Sig Dispense Refill  . alendronate (FOSAMAX) 70 MG tablet TAKE 1 TABLET BY MOUTH ONCE A WEEK FOR 90 DAYS    . ALPRAZolam (XANAX) 0.5 MG tablet Take 0.5 mg by mouth at bedtime as needed. 1/2-1  tablet BID as need for anxiety/sleep    . apixaban (ELIQUIS) 5 MG TABS tablet Take 1 tablet (5 mg total) by mouth 2 (two) times daily. Pt is Overdue for follow-up with MD.  Must see MD for future refills. 60 tablet 0  . cholecalciferol (VITAMIN D) 1000 units tablet Take 1,000 Units by mouth daily with lunch.    . diltiazem (TIAZAC) 300 MG 24 hr capsule Take 300 mg by mouth daily.    . DULoxetine (CYMBALTA) 60 MG capsule TAKE 2 CAPSULES BY MOUTH IN THE EVENING ON A FULL STOMACH WITH HIGH PROTEIN CONTENT    . letrozole (FEMARA) 2.5 MG tablet Take 1 tablet (2.5 mg total) by mouth daily. 90 tablet 3  . methocarbamol (ROBAXIN) 500 MG tablet Take 1 tablet (500 mg total) by mouth every 6 (six) hours as needed for muscle spasms. 20 tablet 1  . metoprolol tartrate (LOPRESSOR) 25 MG tablet TAKE 1 TABLET BY MOUTH EVERY 6 HOURS AS NEEDED FOR AFIB HOUR OVER 100 45 tablet  0  . Multiple Vitamin (MULTIVITAMIN WITH MINERALS) TABS tablet Take 1 tablet by mouth daily with lunch.    . Omega-3 Fatty Acids (FISH OIL) 1000 MG CAPS Take 1,000 mg by mouth daily with lunch.    . pravastatin (PRAVACHOL) 40 MG tablet Take 40 mg by mouth every evening.     Marland Kitchen spironolactone (ALDACTONE) 25 MG tablet Take 25 mg by mouth 2 (two) times daily.      No current facility-administered medications for this visit.    Allergies:   Codeine and Sulfa antibiotics   Social History:  The patient  reports that she has never smoked. She has never used smokeless tobacco. She reports current alcohol use of about 2.0 standard drinks of alcohol  per week. She reports that she does not use drugs.   Family History:  The patient's family history includes Cancer in her brother.    ROS:  Please see the history of present illness.   Otherwise, review of systems is positive for none.   All other systems are reviewed and negative.    PHYSICAL EXAM: VS:  BP (!) 112/56   Pulse (!) 53   Ht 5\' 5"  (1.651 m)   Wt 188 lb (85.3 kg)   SpO2 97%   BMI 31.28 kg/m  , BMI Body mass index is 31.28 kg/m. GEN: Well nourished, well developed, in no acute distress  HEENT: normal  Neck: no JVD, carotid bruits, or masses Cardiac: RRR; no murmurs, rubs, or gallops,no edema  Respiratory:  clear to auscultation bilaterally, normal work of breathing GI: soft, nontender, nondistended, + BS MS: no deformity or atrophy  Skin: warm and dry Neuro:  Strength and sensation are intact Psych: euthymic mood, full affect  EKG:  EKG is ordered today. Personal review of the ekg ordered shows sinus rhythm, rate 53  Recent Labs: 08/25/2019: BUN 12; Creatinine, Ser 0.82; Hemoglobin 12.6; Magnesium 2.1; Platelets 330; Potassium 4.1; Sodium 137    Lipid Panel  No results found for: CHOL, TRIG, HDL, CHOLHDL, VLDL, LDLCALC, LDLDIRECT   Wt Readings from Last 3 Encounters:  08/22/20 188 lb (85.3 kg)  10/05/19 185 lb 8 oz (84.1 kg)  09/23/19 187 lb 12.8 oz (85.2 kg)      Other studies Reviewed: Additional studies/ records that were reviewed today include: TTE 07/13/20  Review of the above records today demonstrates:  1. Left ventricular ejection fraction, by estimation, is 60 to 65%. The  left ventricle has normal function. The left ventricle has no regional  wall motion abnormalities. Left ventricular diastolic parameters are  consistent with Grade II diastolic  dysfunction (pseudonormalization). The average left ventricular global  longitudinal strain is 19.7 %. The global longitudinal strain is normal.  2. Right ventricular systolic function is normal. The  right ventricular  size is normal. There is normal pulmonary artery systolic pressure. The  estimated right ventricular systolic pressure is 08.6 mmHg.  3. The mitral valve is normal in structure. Trivial mitral valve  regurgitation. No evidence of mitral stenosis.  4. The aortic valve is tricuspid. Aortic valve regurgitation is mild.  Mild aortic valve sclerosis is present, with no evidence of aortic valve  stenosis.  5. The inferior vena cava is normal in size with greater than 50%  respiratory variability, suggesting right atrial pressure of 3 mmHg.    ASSESSMENT AND PLAN:  1.  Paroxysmal atrial fibrillation/flutter: Currently on metoprolol, diltiazem, Eliquis.  CHA2DS2-VASc of 4.  Fortunately she remains in sinus rhythm.  She  is also had some SVT.  She takes as needed metoprolol and is comfortable with her overall control.  She does not wish to make changes at this time.  2.  Hypertension: Currently well controlled  3.  Obesity: Lifestyle modification with diet and exercise encouraged  Current medicines are reviewed at length with the patient today.   The patient does not have concerns regarding her medicines.  The following changes were made today:  none  Labs/ tests ordered today include:  Orders Placed This Encounter  Procedures  . EKG 12-Lead     Disposition:   FU with Amarachi Kotz 6 months  Signed, Silva Aamodt Meredith Leeds, MD  08/22/2020 12:24 PM     [facility] [address] [place] [address] [phone] (office) [phone] (fax)

## 2020-08-22 NOTE — Addendum Note (Signed)
Addended by: Stanton Kidney on: 08/22/2020 12:29 PM   Modules accepted: Orders

## 2020-08-22 NOTE — Patient Instructions (Signed)
Medication Instructions:  Your physician recommends that you continue on your current medications as directed. Please refer to the Current Medication list given to you today.  *If you need a refill on your cardiac medications before your next appointment, please call your pharmacy*   Lab Work: None ordered If you have labs (blood work) drawn today and your tests are completely normal, you will receive your results only by: . MyChart Message (if you have MyChart) OR . A paper copy in the mail If you have any lab test that is abnormal or we need to change your treatment, we will call you to review the results.   Testing/Procedures: None ordered   Follow-Up: At [facility], you and your health needs are our priority.  As part of our continuing mission to provide you with exceptional heart care, we have created designated Provider Care Teams.  These Care Teams include your primary Cardiologist (physician) and Advanced Practice Providers (APPs -  Physician Assistants and Nurse Practitioners) who all work together to provide you with the care you need, when you need it.  We recommend signing up for the patient portal called "MyChart".  Sign up information is provided on this After Visit Summary.  MyChart is used to connect with patients for Virtual Visits (Telemedicine).  Patients are able to view lab/test results, encounter notes, upcoming appointments, etc.  Non-urgent messages can be sent to your provider as well.   To learn more about what you can do with MyChart, go to [email or web address].    Your next appointment:   6 month(s)  The format for your next appointment:   In Person  Provider:   Will Camnitz, MD   Thank you for choosing [facility]!!   Clarene Curran, RN [phone]    Other Instructions    

## 2020-10-02 NOTE — Progress Notes (Signed)
 Patient Care Team: Reade, Robert, MD as PCP - General (Family Medicine) Gudena, Vinay, MD as Consulting Physician (Hematology and Oncology) Toth, Paul III, MD as Consulting Physician (General Surgery)  DIAGNOSIS:    ICD-10-CM   1. 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 status post lumpectomy 01/01/2010, ER 99%, PR 100% status post radiation therapy on 03/05/2010 to 04/30/2010, tamoxifen 20 mg daily from June 2011 - June 2016   09/07/2018 Relapse/Recurrence   Screening mammogram detected 6 mm mass at 4 o'clock position relapse in the left breast 4 cm from the nipple grade 2 IDC ER 80%, PR 10%, HER-2 negative IHC 2+ and fish ratio 1.23, Ki-67 2% T1b N0 stage Ia   10/12/2018 Surgery   Left mastectomy: IDC grade 2, 1.2 cm, high-grade DCIS, margins negative, 0/5 lymph nodes negative, ER 80%, PR 10%, HER-2 negative, Ki-67 2% T1CN0 stage Ia    11/2018 -  Anti-estrogen oral therapy   Letrozole daily     CHIEF COMPLIANT: Follow-up of recurrent left breast cancer on letrozole  INTERVAL HISTORY: Erin Gillespie is a 79 y.o. with above-mentioned history of recurrent left breast cancer who underwent a left mastectomy and is currently on anti-estrogen therapy with letrozole. She presents to the clinic today for follow-up.   Because of atrial fibrillation she is currently on blood thinners.  ALLERGIES:  is allergic to codeine and sulfa antibiotics.  MEDICATIONS:  Current Outpatient Medications  Medication Sig Dispense Refill  . alendronate (FOSAMAX) 70 MG tablet TAKE 1 TABLET BY MOUTH ONCE A WEEK FOR 90 DAYS    . ALPRAZolam (XANAX) 0.5 MG tablet Take 0.5 mg by mouth at bedtime  as needed. 1/2-1  tablet BID as need for anxiety/sleep    . apixaban (ELIQUIS) 5 MG TABS tablet Take 1 tablet (5 mg total) by mouth 2 (two) times daily. Pt is Overdue for follow-up with MD.  Must see MD for future refills. 180 tablet 1  . cholecalciferol (VITAMIN D) 1000 units tablet Take 1,000 Units by mouth daily with lunch.    . diltiazem (TIAZAC) 300 MG 24 hr capsule Take 300 mg by mouth daily.    . DULoxetine (CYMBALTA) 60 MG capsule TAKE 2 CAPSULES BY MOUTH IN THE EVENING ON A FULL STOMACH WITH HIGH PROTEIN CONTENT    . letrozole (FEMARA) 2.5 MG tablet Take 1 tablet (2.5 mg total) by mouth daily. 90 tablet 3  . methocarbamol (ROBAXIN) 500 MG tablet Take 1 tablet (500 mg total) by mouth every 6 (six) hours as needed for muscle spasms. 20 tablet 1  . metoprolol tartrate (LOPRESSOR) 25 MG tablet TAKE 1 TABLET BY MOUTH EVERY 6 HOURS AS NEEDED FOR AFIB HOUR OVER 100 135 tablet 1  . Multiple Vitamin (MULTIVITAMIN WITH MINERALS) TABS tablet Take 1 tablet by mouth daily with lunch.    . Omega-3 Fatty Acids (FISH OIL) 1000 MG CAPS Take 1,000 mg by mouth daily with lunch.    . pravastatin (PRAVACHOL) 40 MG tablet Take 40 mg by mouth every evening.     . spironolactone (ALDACTONE) 25 MG tablet Take 25 mg by mouth 2 (two) times daily.      No current facility-administered medications   for this visit.    PHYSICAL EXAMINATION: ECOG PERFORMANCE STATUS: 1 - Symptomatic but completely ambulatory  Vitals:   10/03/20 0931  BP: (!) 131/57  Pulse: 61  Resp: 18  Temp: (!) 96.5 F (35.8 C)  SpO2: 96%   Filed Weights   10/03/20 0931  Weight: 186 lb (84.4 kg)    BREAST: No palpable masses or nodules in either right or left breasts. No palpable axillary supraclavicular or infraclavicular adenopathy no breast tenderness or nipple discharge. (exam performed in the presence of a chaperone)  LABORATORY DATA:  I have reviewed the data as listed CMP Latest Ref Rng & Units 08/25/2019 10/09/2018 05/25/2015    Glucose 70 - 99 mg/dL 99 103(H) 145(H)  BUN 8 - 23 mg/dL _0 Creatinine 0.44 - 1.00 mg/dL 0.82 0.93 0.96  Sodium 135 - 145 mmol/L 137 137 136  Potassium 3.5 - 5.1 mmol/L 4.1 3.7 3.9  Chloride 98 - 111 mmol/L 105 105 99(L)  CO2 22 - 32 mmol/L 21(L) 27 25  Calcium 8.9 - 10.3 mg/dL 9.4 9.4 9.0  Total Protein 6.5 - 8.1 g/dL - - 7.5  Total Bilirubin 0.3 - 1.2 mg/dL - - 0.8  Alkaline Phos 38 - 126 U/L - - 54  AST 15 - 41 U/L - - 19  ALT 14 - 54 U/L - - 14    Lab Results  Component Value Date   WBC 11.2 (H) 08/25/2019   HGB 12.6 08/25/2019   HCT 38.5 08/25/2019   MCV 89.5 08/25/2019   PLT 330 08/25/2019   NEUTROABS 7.7 08/25/2019    ASSESSMENT & PLAN:  Malignant neoplasm of lower-outer quadrant of left breast of female, estrogen receptor positive ([place]) 10/12/2018:Left mastectomy: IDC grade 2, 1.2 cm, high-grade DCIS, margins negative, 0/5 lymph nodes negative, ER 80%, PR 10%, HER-2 negative, Ki-67 2% T1CN0 stage Ia  (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  Letrozole toxicities: Denies any adverse effects to letrozole. H/o atrial fibrillation: Currently on blood thinners.  Breast cancer surveillance: 1.  Breast exam 10/03/2020: Benign 2.  Mammogram at [facility] 09/21/2019 benign, breast density category C She plans to get another mammogram very soon.  Return to clinic in 1 year for follow-up    No orders of the defined types were placed in this encounter.  The patient has a good understanding of the overall plan. she agrees with it. she will call with any problems that may develop before the next visit here.  Total time spent: 20 mins including face to face time and time spent for  planning, charting and coordination of care  Nicholas Lose, MD 10/03/2020  I, Cloyde Reams Dorshimer, am acting as scribe for Dr. Nicholas Lose.  I have reviewed the above documentation for accuracy and completeness, and I agree with the above.

## 2020-10-03 ENCOUNTER — Other Ambulatory Visit: Payer: Self-pay

## 2020-10-03 ENCOUNTER — Inpatient Hospital Stay: Payer: Medicare Other | Attending: Hematology and Oncology | Admitting: Hematology and Oncology

## 2020-10-03 DIAGNOSIS — Z170 Estrogen receptor positive status [ER+]: Secondary | ICD-10-CM | POA: Diagnosis not present

## 2020-10-03 DIAGNOSIS — Z79899 Other long term (current) drug therapy: Secondary | ICD-10-CM | POA: Insufficient documentation

## 2020-10-03 DIAGNOSIS — Z923 Personal history of irradiation: Secondary | ICD-10-CM | POA: Insufficient documentation

## 2020-10-03 DIAGNOSIS — Z7901 Long term (current) use of anticoagulants: Secondary | ICD-10-CM | POA: Diagnosis not present

## 2020-10-03 DIAGNOSIS — C50512 Malignant neoplasm of lower-outer quadrant of left female breast: Secondary | ICD-10-CM | POA: Diagnosis present

## 2020-10-03 DIAGNOSIS — I4891 Unspecified atrial fibrillation: Secondary | ICD-10-CM | POA: Diagnosis not present

## 2020-10-03 DIAGNOSIS — Z79811 Long term (current) use of aromatase inhibitors: Secondary | ICD-10-CM | POA: Insufficient documentation

## 2020-10-03 MED ORDER — LETROZOLE 2.5 MG PO TABS: 2.5000 mg | ORAL_TABLET | Freq: Every day | ORAL | 3 refills | Status: DC

## 2020-10-03 NOTE — Assessment & Plan Note (Signed)
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 H/o atrial fibrillation: Today she is in sinus rhythm.    Breast cancer surveillance: 1.  Breast exam 10/03/2020: Benign 2.  Mamm[facility] 09/21/2019 benign, breast density category C  Return to clinic in 1 year for follow-up

## 2020-10-05 ENCOUNTER — Ambulatory Visit: Payer: Medicare Other | Admitting: Hematology and Oncology

## 2021-01-10 DIAGNOSIS — Z170 Estrogen receptor positive status [ER+]: Secondary | ICD-10-CM | POA: Diagnosis not present

## 2021-01-10 DIAGNOSIS — C50512 Malignant neoplasm of lower-outer quadrant of left female breast: Secondary | ICD-10-CM | POA: Diagnosis not present

## 2021-02-20 DIAGNOSIS — C50512 Malignant neoplasm of lower-outer quadrant of left female breast: Secondary | ICD-10-CM | POA: Diagnosis not present

## 2021-02-20 DIAGNOSIS — Z170 Estrogen receptor positive status [ER+]: Secondary | ICD-10-CM | POA: Diagnosis not present

## 2021-02-27 ENCOUNTER — Ambulatory Visit: Payer: Medicare Other | Admitting: Cardiology

## 2021-02-27 NOTE — Progress Notes (Deleted)
Electrophysiology Office Note   Date:  02/27/2021   ID:  Parker, Sawatzky Oct 31, 1941, MRN [id number]  PCP:  Maury Dus, MD  Cardiologist:   Primary Electrophysiologist:  Will Meredith Leeds, MD    Chief Complaint: AF   History of Present Illness: Erin Gillespie is a 80 y.o. female who is being seen today for the evaluation of AF at the request of Maury Dus, MD. Presenting today for electrophysiology evaluation.  She has a history significant for hypertension, breast cancer, hyperlipidemia, SVT, and paroxysmal atrial fibrillation.  She presented with atrial fibrillation 08/25/2019 after going to the emergency room with palpitations.  She converted after IV metoprolol and was started on Eliquis.  She had recently finished antibiotics for skin infection.  She was also found to have a UTI in the emergency room and was started on antibiotics.  She continued to have episodes of palpitations and wore a 30-day monitor that showed episodes of SVT.  Today, denies symptoms of palpitations, chest pain, shortness of breath, orthopnea, PND, lower extremity edema, claudication, dizziness, presyncope, syncope, bleeding, or neurologic sequela. The patient is tolerating medications without difficulties. ***    Past Medical History:  Diagnosis Date  . Anxiety   . Breast cancer ([place])   . Cancer ([place])   . Heart murmur    echo confirmed over 5+ years ago.   Marland Kitchen Hypercholesterolemia   . Hypertension    Past Surgical History:  Procedure Laterality Date  . CATARACT EXTRACTION, BILATERAL Bilateral 2003   approx 2003  . CHOLECYSTECTOMY    . CHOLECYSTECTOMY, LAPAROSCOPIC  1990  . SIMPLE MASTECTOMY WITH AXILLARY SENTINEL NODE BIOPSY Left 10/12/2018   Procedure: LEFT  MASTECTOMY WITH  SENTINEL NODE BIOPSY;  Surgeon: Jovita Kussmaul, MD;  Location: [facility];  Service: General;  Laterality: Left;     Current Outpatient Medications  Medication Sig Dispense Refill  . alendronate (FOSAMAX) 70 MG tablet TAKE  1 TABLET BY MOUTH ONCE A WEEK FOR 90 DAYS    . ALPRAZolam (XANAX) 0.5 MG tablet Take 0.5 mg by mouth at bedtime as needed. 1/2-1  tablet BID as need for anxiety/sleep    . apixaban (ELIQUIS) 5 MG TABS tablet Take 1 tablet (5 mg total) by mouth 2 (two) times daily. Pt is Overdue for follow-up with MD.  Must see MD for future refills. 180 tablet 1  . cholecalciferol (VITAMIN D) 1000 units tablet Take 1,000 Units by mouth daily with lunch.    . diltiazem (TIAZAC) 300 MG 24 hr capsule Take 300 mg by mouth daily.    . DULoxetine (CYMBALTA) 60 MG capsule TAKE 2 CAPSULES BY MOUTH IN THE EVENING ON A FULL STOMACH WITH HIGH PROTEIN CONTENT    . letrozole (FEMARA) 2.5 MG tablet Take 1 tablet (2.5 mg total) by mouth daily. 90 tablet 3  . methocarbamol (ROBAXIN) 500 MG tablet Take 1 tablet (500 mg total) by mouth every 6 (six) hours as needed for muscle spasms. 20 tablet 1  . metoprolol tartrate (LOPRESSOR) 25 MG tablet TAKE 1 TABLET BY MOUTH EVERY 6 HOURS AS NEEDED FOR AFIB HOUR OVER 100 135 tablet 1  . Multiple Vitamin (MULTIVITAMIN WITH MINERALS) TABS tablet Take 1 tablet by mouth daily with lunch.    . Omega-3 Fatty Acids (FISH OIL) 1000 MG CAPS Take 1,000 mg by mouth daily with lunch.    . pravastatin (PRAVACHOL) 40 MG tablet Take 40 mg by mouth every evening.     Marland Kitchen spironolactone (  ALDACTONE) 25 MG tablet Take 25 mg by mouth 2 (two) times daily.      No current facility-administered medications for this visit.    Allergies:   Codeine and Sulfa antibiotics   Social History:  The patient  reports that she has never smoked. She has never used smokeless tobacco. She reports current alcohol use of about 2.0 standard drinks of alcohol per week. She reports that she does not use drugs.   Family History:  The patient's family history includes Cancer in her brother.   ROS:  Please see the history of present illness.   Otherwise, review of systems is positive for none.   All other systems are reviewed and  negative.   PHYSICAL EXAM: VS:  There were no vitals taken for this visit. , BMI There is no height or weight on file to calculate BMI. GEN: Well nourished, well developed, in no acute distress  HEENT: normal  Neck: no JVD, carotid bruits, or masses Cardiac: ***RRR; no murmurs, rubs, or gallops,no edema  Respiratory:  clear to auscultation bilaterally, normal work of breathing GI: soft, nontender, nondistended, + BS MS: no deformity or atrophy  Skin: warm and dry Neuro:  Strength and sensation are intact Psych: euthymic mood, full affect  EKG:  EKG {ACTION; IS/IS YIR:[id number]} ordered today. Personal review of the ekg ordered *** shows ***   Recent Labs: No results found for requested labs within last 8760 hours.    Lipid Panel  No results found for: CHOL, TRIG, HDL, CHOLHDL, VLDL, LDLCALC, LDLDIRECT   Wt Readings from Last 3 Encounters:  10/03/20 186 lb (84.4 kg)  08/22/20 188 lb (85.3 kg)  10/05/19 185 lb 8 oz (84.1 kg)      Other studies Reviewed: Additional studies/ records that were reviewed today include: TTE 07/13/20  Review of the above records today demonstrates:  1. Left ventricular ejection fraction, by estimation, is 60 to 65%. The  left ventricle has normal function. The left ventricle has no regional  wall motion abnormalities. Left ventricular diastolic parameters are  consistent with Grade II diastolic  dysfunction (pseudonormalization). The average left ventricular global  longitudinal strain is 19.7 %. The global longitudinal strain is normal.  2. Right ventricular systolic function is normal. The right ventricular  size is normal. There is normal pulmonary artery systolic pressure. The  estimated right ventricular systolic pressure is 35.0 mmHg.  3. The mitral valve is normal in structure. Trivial mitral valve  regurgitation. No evidence of mitral stenosis.  4. The aortic valve is tricuspid. Aortic valve regurgitation is mild.  Mild aortic valve  sclerosis is present, with no evidence of aortic valve  stenosis.  5. The inferior vena cava is normal in size with greater than 50%  respiratory variability, suggesting right atrial pressure of 3 mmHg.    ASSESSMENT AND PLAN:  1.  Paroxysmal atrial fibrillation/flutter: Currently on metoprolol, diltiazem, Eliquis.  CHA2DS2-VASc of 4.  Has also had some runs of SVT.  ***  2.  Hypertension:***  3.  Obesity:***  Current medicines are reviewed at length with the patient today.   The patient does not have concerns regarding her medicines.  The following changes were made today:  ***  Labs/ tests ordered today include:  No orders of the defined types were placed in this encounter.    Disposition:   FU with Will Camnitz *** months  Signed, Will Meredith Leeds, MD  02/27/2021 8:47 AM     [facility]  [address] [place] [address] [phone] (office) [phone] (fax)

## 2021-02-28 DIAGNOSIS — D0510 Intraductal carcinoma in situ of unspecified breast: Secondary | ICD-10-CM | POA: Diagnosis not present

## 2021-02-28 DIAGNOSIS — I10 Essential (primary) hypertension: Secondary | ICD-10-CM | POA: Diagnosis not present

## 2021-02-28 DIAGNOSIS — Z853 Personal history of malignant neoplasm of breast: Secondary | ICD-10-CM | POA: Diagnosis not present

## 2021-02-28 DIAGNOSIS — I480 Paroxysmal atrial fibrillation: Secondary | ICD-10-CM | POA: Diagnosis not present

## 2021-02-28 DIAGNOSIS — C50919 Malignant neoplasm of unspecified site of unspecified female breast: Secondary | ICD-10-CM | POA: Diagnosis not present

## 2021-02-28 DIAGNOSIS — E7800 Pure hypercholesterolemia, unspecified: Secondary | ICD-10-CM | POA: Diagnosis not present

## 2021-02-28 DIAGNOSIS — M8580 Other specified disorders of bone density and structure, unspecified site: Secondary | ICD-10-CM | POA: Diagnosis not present

## 2021-03-21 DIAGNOSIS — M8580 Other specified disorders of bone density and structure, unspecified site: Secondary | ICD-10-CM | POA: Diagnosis not present

## 2021-03-21 DIAGNOSIS — R609 Edema, unspecified: Secondary | ICD-10-CM | POA: Diagnosis not present

## 2021-03-21 DIAGNOSIS — E7800 Pure hypercholesterolemia, unspecified: Secondary | ICD-10-CM | POA: Diagnosis not present

## 2021-03-21 DIAGNOSIS — I129 Hypertensive chronic kidney disease with stage 1 through stage 4 chronic kidney disease, or unspecified chronic kidney disease: Secondary | ICD-10-CM | POA: Diagnosis not present

## 2021-03-21 DIAGNOSIS — M545 Low back pain, unspecified: Secondary | ICD-10-CM | POA: Diagnosis not present

## 2021-03-21 DIAGNOSIS — C50919 Malignant neoplasm of unspecified site of unspecified female breast: Secondary | ICD-10-CM | POA: Diagnosis not present

## 2021-03-21 DIAGNOSIS — M25562 Pain in left knee: Secondary | ICD-10-CM | POA: Diagnosis not present

## 2021-03-21 DIAGNOSIS — D6869 Other thrombophilia: Secondary | ICD-10-CM | POA: Diagnosis not present

## 2021-03-21 DIAGNOSIS — N1832 Chronic kidney disease, stage 3b: Secondary | ICD-10-CM | POA: Diagnosis not present

## 2021-03-21 DIAGNOSIS — Z0000 Encounter for general adult medical examination without abnormal findings: Secondary | ICD-10-CM | POA: Diagnosis not present

## 2021-03-21 DIAGNOSIS — I480 Paroxysmal atrial fibrillation: Secondary | ICD-10-CM | POA: Diagnosis not present

## 2021-05-22 DIAGNOSIS — D508 Other iron deficiency anemias: Secondary | ICD-10-CM | POA: Diagnosis not present

## 2021-05-24 DIAGNOSIS — M8580 Other specified disorders of bone density and structure, unspecified site: Secondary | ICD-10-CM | POA: Diagnosis not present

## 2021-05-24 DIAGNOSIS — I10 Essential (primary) hypertension: Secondary | ICD-10-CM | POA: Diagnosis not present

## 2021-05-24 DIAGNOSIS — C50919 Malignant neoplasm of unspecified site of unspecified female breast: Secondary | ICD-10-CM | POA: Diagnosis not present

## 2021-05-24 DIAGNOSIS — N1832 Chronic kidney disease, stage 3b: Secondary | ICD-10-CM | POA: Diagnosis not present

## 2021-05-24 DIAGNOSIS — Z853 Personal history of malignant neoplasm of breast: Secondary | ICD-10-CM | POA: Diagnosis not present

## 2021-05-24 DIAGNOSIS — E7800 Pure hypercholesterolemia, unspecified: Secondary | ICD-10-CM | POA: Diagnosis not present

## 2021-05-24 DIAGNOSIS — I129 Hypertensive chronic kidney disease with stage 1 through stage 4 chronic kidney disease, or unspecified chronic kidney disease: Secondary | ICD-10-CM | POA: Diagnosis not present

## 2021-05-24 DIAGNOSIS — D0510 Intraductal carcinoma in situ of unspecified breast: Secondary | ICD-10-CM | POA: Diagnosis not present

## 2021-05-24 DIAGNOSIS — I480 Paroxysmal atrial fibrillation: Secondary | ICD-10-CM | POA: Diagnosis not present

## 2021-05-24 DIAGNOSIS — D508 Other iron deficiency anemias: Secondary | ICD-10-CM | POA: Diagnosis not present

## 2021-08-02 DIAGNOSIS — N1832 Chronic kidney disease, stage 3b: Secondary | ICD-10-CM | POA: Diagnosis not present

## 2021-08-02 DIAGNOSIS — M8580 Other specified disorders of bone density and structure, unspecified site: Secondary | ICD-10-CM | POA: Diagnosis not present

## 2021-08-02 DIAGNOSIS — E7800 Pure hypercholesterolemia, unspecified: Secondary | ICD-10-CM | POA: Diagnosis not present

## 2021-08-02 DIAGNOSIS — D508 Other iron deficiency anemias: Secondary | ICD-10-CM | POA: Diagnosis not present

## 2021-08-02 DIAGNOSIS — I10 Essential (primary) hypertension: Secondary | ICD-10-CM | POA: Diagnosis not present

## 2021-08-02 DIAGNOSIS — I129 Hypertensive chronic kidney disease with stage 1 through stage 4 chronic kidney disease, or unspecified chronic kidney disease: Secondary | ICD-10-CM | POA: Diagnosis not present

## 2021-08-02 DIAGNOSIS — I480 Paroxysmal atrial fibrillation: Secondary | ICD-10-CM | POA: Diagnosis not present

## 2021-09-05 ENCOUNTER — Other Ambulatory Visit: Payer: Self-pay | Admitting: Cardiology

## 2021-09-05 NOTE — Telephone Encounter (Addendum)
Prescription refill request for Eliquis received. Indication: Afib  Last office visit: 08/22/20 (Camnitz) - Pt has scheduled appt with Dr Curt Bears on 12/12/21.  Scr: 0.97 (03/21/21 via [facility])  Age: 80 Weight: 84.4kg   Appropriate dose and refill sent to requested pharmacy.

## 2021-09-06 ENCOUNTER — Telehealth: Payer: Self-pay | Admitting: Cardiology

## 2021-09-06 NOTE — Telephone Encounter (Signed)
Please refer to Eliquis refill encounter from today. Thanks

## 2021-09-06 NOTE — Telephone Encounter (Signed)
*  STAT* If patient is at the pharmacy, call can be transferred to refill team.   1. Which medications need to be refilled? (please list name of each medication and dose if known)  apixaban (ELIQUIS) 5 MG TABS tablet  2. Which pharmacy/location (including street and city if local pharmacy) is medication to be sent to? [facility] ([place]), [place] [address]  3. Do they need a 30 day or 90 day supply? 90ds

## 2021-10-02 DIAGNOSIS — N1832 Chronic kidney disease, stage 3b: Secondary | ICD-10-CM | POA: Diagnosis not present

## 2021-10-02 DIAGNOSIS — D6869 Other thrombophilia: Secondary | ICD-10-CM | POA: Diagnosis not present

## 2021-10-02 DIAGNOSIS — R609 Edema, unspecified: Secondary | ICD-10-CM | POA: Diagnosis not present

## 2021-10-02 DIAGNOSIS — C50919 Malignant neoplasm of unspecified site of unspecified female breast: Secondary | ICD-10-CM | POA: Diagnosis not present

## 2021-10-02 DIAGNOSIS — I480 Paroxysmal atrial fibrillation: Secondary | ICD-10-CM | POA: Diagnosis not present

## 2021-10-02 DIAGNOSIS — I129 Hypertensive chronic kidney disease with stage 1 through stage 4 chronic kidney disease, or unspecified chronic kidney disease: Secondary | ICD-10-CM | POA: Diagnosis not present

## 2021-10-02 DIAGNOSIS — Z23 Encounter for immunization: Secondary | ICD-10-CM | POA: Diagnosis not present

## 2021-10-02 DIAGNOSIS — E7800 Pure hypercholesterolemia, unspecified: Secondary | ICD-10-CM | POA: Diagnosis not present

## 2021-10-02 DIAGNOSIS — M25562 Pain in left knee: Secondary | ICD-10-CM | POA: Diagnosis not present

## 2021-10-02 DIAGNOSIS — M8580 Other specified disorders of bone density and structure, unspecified site: Secondary | ICD-10-CM | POA: Diagnosis not present

## 2021-10-03 NOTE — Progress Notes (Signed)
Patient Care Team: Maury Dus, MD as PCP - General (Family Medicine) Nicholas Lose, MD as Consulting Physician (Hematology and Oncology) Jovita Kussmaul, MD as Consulting Physician (General Surgery)  DIAGNOSIS:    ICD-10-CM   1. 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 status post lumpectomy 01/01/2010, ER 99%, PR 100% status post radiation therapy on 03/05/2010 to 04/30/2010, tamoxifen 20 mg daily from June 2011 - June 2016   09/07/2018 Relapse/Recurrence   Screening mammogram detected 6 mm mass at 4 o'clock position relapse in the left breast 4 cm from the nipple grade 2 IDC ER 80%, PR 10%, HER-2 negative IHC 2+ and fish ratio 1.23, Ki-67 2% T1b N0 stage Ia   10/12/2018 Surgery   Left mastectomy: IDC grade 2, 1.2 cm, high-grade DCIS, margins negative, 0/5 lymph nodes negative, ER 80%, PR 10%, HER-2 negative, Ki-67 2% T1CN0 stage Ia    11/2018 -  Anti-estrogen oral therapy   Letrozole daily     CHIEF COMPLIANT: Follow-up of recurrent left breast cancer on letrozole  INTERVAL HISTORY: Erin Gillespie is a 80 y.o. with above-mentioned history of recurrent left breast cancer who underwent a left mastectomy and is currently on anti-estrogen therapy with letrozole. She presents to the clinic today for follow-up.  She is complaining of left arm bursitis and pain and discomfort.  She is seeing her physicians who are helping her with medications. She denies any hot flashes or arthralgias or myalgias related to letrozole.  Initially she had nausea which resolved after she started taking letrozole at that time.  ALLERGIES:  is allergic to codeine and sulfa  antibiotics.  MEDICATIONS:  Current Outpatient Medications  Medication Sig Dispense Refill   alendronate (FOSAMAX) 70 MG tablet TAKE 1 TABLET BY MOUTH ONCE A WEEK FOR 90 DAYS     ALPRAZolam (XANAX) 0.5 MG tablet Take 0.5 mg by mouth at bedtime as needed. 1/2-1  tablet BID as need for anxiety/sleep     cholecalciferol (VITAMIN D) 1000 units tablet Take 1,000 Units by mouth daily with lunch.     diltiazem (TIAZAC) 300 MG 24 hr capsule Take 300 mg by mouth daily.     DULoxetine (CYMBALTA) 60 MG capsule TAKE 2 CAPSULES BY MOUTH IN THE EVENING ON A FULL STOMACH WITH HIGH PROTEIN CONTENT     ELIQUIS 5 MG TABS tablet TAKE 1 TABLET BY MOUTH TWICE DAILY . APPOINTMENT REQUIRED FOR FUTURE REFILLS 180 tablet 0   letrozole (FEMARA) 2.5 MG tablet Take 1 tablet (2.5 mg total) by mouth daily. 90 tablet 3   methocarbamol (ROBAXIN) 500 MG tablet Take 1 tablet (500 mg total) by mouth every 6 (six) hours as needed for muscle spasms. 20 tablet 1   metoprolol tartrate (LOPRESSOR) 25 MG tablet TAKE 1 TABLET BY MOUTH EVERY 6 HOURS AS NEEDED FOR AFIB HOUR OVER 100 135 tablet 1   Multiple Vitamin (MULTIVITAMIN WITH MINERALS) TABS tablet Take 1 tablet by mouth daily with lunch.     Omega-3 Fatty Acids (FISH OIL) 1000 MG CAPS Take 1,000 mg by mouth daily with lunch.     pravastatin (PRAVACHOL) 40 MG tablet Take 40 mg by mouth every evening.  spironolactone (ALDACTONE) 25 MG tablet Take 25 mg by mouth 2 (two) times daily.      No current facility-administered medications for this visit.    PHYSICAL EXAMINATION: ECOG PERFORMANCE STATUS: 1 - Symptomatic but completely ambulatory  Vitals:   10/04/21 0956  BP: 138/61  Pulse: 74  Resp: 18  Temp: (!) 97.5 F (36.4 C)  SpO2: 97%   Filed Weights   10/04/21 0956  Weight: 183 lb 12.8 oz (83.4 kg)    BREAST: No palpable lumps or nodules in the right breast or axilla.  Left chest wall is without any lumps or nodules there is tenderness in the left axilla.. (exam  performed in the presence of a chaperone) Extremities: She has difficulty lifting her left arm by herself she has to use her right arm to pull the left arm upwards.  She attributes this to bursitis.  LABORATORY DATA:  I have reviewed the data as listed CMP Latest Ref Rng & Units 08/25/2019 10/09/2018 05/25/2015  Glucose 70 - 99 mg/dL 99 103(H) 145(H)  BUN 8 - 23 mg/dL _0 Creatinine 0.44 - 1.00 mg/dL 0.82 0.93 0.96  Sodium 135 - 145 mmol/L 137 137 136  Potassium 3.5 - 5.1 mmol/L 4.1 3.7 3.9  Chloride 98 - 111 mmol/L 105 105 99(L)  CO2 22 - 32 mmol/L 21(L) 27 25  Calcium 8.9 - 10.3 mg/dL 9.4 9.4 9.0  Total Protein 6.5 - 8.1 g/dL - - 7.5  Total Bilirubin 0.3 - 1.2 mg/dL - - 0.8  Alkaline Phos 38 - 126 U/L - - 54  AST 15 - 41 U/L - - 19  ALT 14 - 54 U/L - - 14    Lab Results  Component Value Date   WBC 11.2 (H) 08/25/2019   HGB 12.6 08/25/2019   HCT 38.5 08/25/2019   MCV 89.5 08/25/2019   PLT 330 08/25/2019   NEUTROABS 7.7 08/25/2019    ASSESSMENT & PLAN:  Malignant neoplasm of lower-outer quadrant of left breast of female, estrogen receptor positive ([place]) 10/12/2018: Left mastectomy: IDC grade 2, 1.2 cm, high-grade DCIS, margins negative, 0/5 lymph nodes negative, ER 80%, PR 10%, HER-2 negative, Ki-67 2% T1CN0 stage Ia   (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   Letrozole toxicities: Denies any adverse effects to letrozole. H/o atrial fibrillation: Currently on blood thinners.   Breast cancer surveillance: 1.  Breast exam  10/04/2021: Benign 2.  Mammogram at [facility]  11/24/2020 benign, breast density category C She plans to get another mammogram very soon.   Return to clinic in 1 year for  follow-up    No orders of the defined types were placed in this encounter.  The patient has a good understanding of the overall plan. she agrees with it. she will call with any problems that may develop before the next visit here.  Total time spent: 20 mins including face to face time and time spent for planning, charting and coordination of care  Rulon Eisenmenger, MD, MPH 10/04/2021  I, Thana Ates, am acting as scribe for Dr. Nicholas Lose.  I have reviewed the above documentation for accuracy and completeness, and I agree with the above.

## 2021-10-04 ENCOUNTER — Other Ambulatory Visit: Payer: Self-pay

## 2021-10-04 ENCOUNTER — Inpatient Hospital Stay: Payer: Medicare Other | Attending: Hematology and Oncology | Admitting: Hematology and Oncology

## 2021-10-04 DIAGNOSIS — Z9012 Acquired absence of left breast and nipple: Secondary | ICD-10-CM | POA: Insufficient documentation

## 2021-10-04 DIAGNOSIS — Z170 Estrogen receptor positive status [ER+]: Secondary | ICD-10-CM | POA: Diagnosis not present

## 2021-10-04 DIAGNOSIS — C50512 Malignant neoplasm of lower-outer quadrant of left female breast: Secondary | ICD-10-CM | POA: Diagnosis not present

## 2021-10-04 DIAGNOSIS — Z79811 Long term (current) use of aromatase inhibitors: Secondary | ICD-10-CM | POA: Insufficient documentation

## 2021-10-04 MED ORDER — LETROZOLE 2.5 MG PO TABS: 2.5000 mg | ORAL_TABLET | Freq: Every day | ORAL | 3 refills | Status: DC

## 2021-10-04 NOTE — Assessment & Plan Note (Addendum)
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  Letrozoletoxicities:Denies any adverse effects to letrozole. H/o atrial fibrillation: Currently on blood thinners.  Breast cancer surveillance: 1.Breast exam  10/04/2021: Benign 2.Mammogram at [facility]  11/24/2020 benign, breast density category C She plans to get another mammogram very soon.  Return to clinic in 1 year for follow-up

## 2021-12-12 ENCOUNTER — Ambulatory Visit: Payer: Medicare Other | Admitting: Cardiology

## 2021-12-16 ENCOUNTER — Other Ambulatory Visit: Payer: Self-pay | Admitting: Cardiology

## 2021-12-17 ENCOUNTER — Other Ambulatory Visit: Payer: Self-pay

## 2021-12-17 NOTE — Telephone Encounter (Signed)
-----   Message from Alben Spittle sent at 12/17/2021 12:18 PM EST ----- Regarding: FW: Appointment with Alba Destine! I got this lady scheduled for an appt. She said that she is out of her medication today and wants to know if she will be able to have any sent in to last until next Tuesday? ----- Message ----- From: Armando Gang Sent: 12/17/2021  11:56 AM EST To: Brynda Peon, RN, Alben Spittle Subject: Appointment with Idelle Leech - I forward this message to [facility] who is the scheduler for Dr. Curt Bears ----- Message ----- From: Brynda Peon, RN Sent: 12/17/2021   8:21 AM EST To: Cv [facility] Scheduling  Pt last saw Dr Curt Bears 08/22/20, pt is overdue for 6 month follow-up. Pt No showed for appts made with Dr Curt Bears on 02/27/21 and 12/12/21.  Pt must be seen for refills, pt requesting Eliquis refill. Please call pt to schedule OV and advise MUST keep f/u for rx refills.  Thanks

## 2021-12-17 NOTE — Telephone Encounter (Signed)
Pt last saw Dr Curt Bears 08/22/20, pt is overdue for 6 month follow-up. Pt No showed for appts made with Dr Curt Bears on 02/27/21 and 12/12/21.  Pt must be seen for refills, pt requesting Eliquis refill. Message sent to schedulers to contact pt to make appt.

## 2021-12-17 NOTE — Telephone Encounter (Signed)
Last labs 10/02/21 Creat 0.93 at [facility] per [facility], age 81, weight 83.4kg, based on specified criteria pt is on appropriate dosage of Eliquis 5mg  BID.  Will only refill x a 1 month supply to get pt to upcoming OV with Oda Kilts, PA on 12/25/21, since pt has no showed x 2appts in the past year and is aware must keep OV and see MD for future refills.

## 2021-12-24 NOTE — Progress Notes (Signed)
PCP:  Maury Dus, MD Primary Cardiologist: None Electrophysiologist: Will Meredith Leeds, MD   Erin Gillespie is a 81 y.o. female seen today for Will Meredith Leeds, MD for routine electrophysiology followup.  Since last being seen in our clinic the patient reports doing very well.  she denies chest pain, palpitations, dyspnea, PND, orthopnea, nausea, vomiting, dizziness, syncope, edema, weight gain, or early satiety.  Past Medical History:  Diagnosis Date   Anxiety    Breast cancer ([place])    Cancer ([place])    Heart murmur    echo confirmed over 5+ years ago.    Hypercholesterolemia    Hypertension    Past Surgical History:  Procedure Laterality Date   CATARACT EXTRACTION, BILATERAL Bilateral 2003   approx 2003   CHOLECYSTECTOMY     CHOLECYSTECTOMY, LAPAROSCOPIC  1990   SIMPLE MASTECTOMY WITH AXILLARY SENTINEL NODE BIOPSY Left 10/12/2018   Procedure: LEFT  MASTECTOMY WITH  SENTINEL NODE BIOPSY;  Surgeon: Jovita Kussmaul, MD;  Location: [place];  Service: General;  Laterality: Left;    Current Outpatient Medications  Medication Sig Dispense Refill   acetaminophen (TYLENOL) 650 MG CR tablet as needed.     alendronate (FOSAMAX) 70 MG tablet TAKE 1 TABLET BY MOUTH ONCE A WEEK FOR 90 DAYS     ALPRAZolam (XANAX) 0.5 MG tablet Take 0.5 mg by mouth at bedtime as needed. 1/2-1  tablet BID as need for anxiety/sleep     apixaban (ELIQUIS) 5 MG TABS tablet TAKE 1 TABLET BY MOUTH TWICE DAILY . APPOINTMENT REQUIRED FOR FUTURE REFILLS 60 tablet 0   Ascorbic Acid (VITAMIN C) 1000 MG tablet daily at 6 (six) AM.     calcium carbonate (OSCAL) 1500 (600 Ca) MG TABS tablet daily at 6 (six) AM.     cholecalciferol (VITAMIN D) 1000 units tablet Take 1,000 Units by mouth daily with lunch.     diltiazem (TIAZAC) 300 MG 24 hr capsule Take 300 mg by mouth daily.     DULoxetine (CYMBALTA) 60 MG capsule TAKE 2 CAPSULES BY MOUTH IN THE EVENING ON A FULL STOMACH WITH HIGH PROTEIN CONTENT     Ferrous Sulfate  (SLOW FE) 142 (45 Fe) MG TBCR daily.     letrozole (FEMARA) 2.5 MG tablet Take 1 tablet (2.5 mg total) by mouth daily. 90 tablet 3   methocarbamol (ROBAXIN) 500 MG tablet Take 1 tablet (500 mg total) by mouth every 6 (six) hours as needed for muscle spasms. 20 tablet 1   metoprolol tartrate (LOPRESSOR) 25 MG tablet TAKE 1 TABLET BY MOUTH EVERY 6 HOURS AS NEEDED FOR AFIB HOUR OVER 100 135 tablet 1   Multiple Vitamin (MULTIVITAMIN WITH MINERALS) TABS tablet Take 1 tablet by mouth daily with lunch.     Omega-3 Fatty Acids (FISH OIL) 1000 MG CAPS Take 1,000 mg by mouth daily with lunch.     pravastatin (PRAVACHOL) 40 MG tablet Take 40 mg by mouth every evening.      spironolactone (ALDACTONE) 25 MG tablet Take 25 mg by mouth 2 (two) times daily.      No current facility-administered medications for this visit.    Allergies  Allergen Reactions   Clonidine Hcl     Other reaction(s): unknown   Lisinopril     Other reaction(s): unknown   Nifedipine     Other reaction(s): unknown   Terazosin     Other reaction(s): unknown   Codeine Nausea And Vomiting and Rash   Sulfa Antibiotics Rash  Social History   Socioeconomic History   Marital status: Widowed    Spouse name: Not on file   Number of children: Not on file   Years of education: Not on file   Highest education level: Not on file  Occupational History   Not on file  Tobacco Use   Smoking status: Never   Smokeless tobacco: Never  Vaping Use   Vaping Use: Never used  Substance and Sexual Activity   Alcohol use: Yes    Alcohol/week: 2.0 standard drinks    Types: 2 Shots of liquor per week    Comment: occasional    Drug use: No   Sexual activity: Not Currently  Other Topics Concern   Not on file  Social History Narrative   Not on file   Social Determinants of Health   Financial Resource Strain: Not on file  Food Insecurity: Not on file  Transportation Needs: Not on file  Physical Activity: Not on file  Stress: Not on  file  Social Connections: Not on file  Intimate Partner Violence: Not on file    Review of Systems: All other systems reviewed and are otherwise negative except as noted above.  Physical Exam: Vitals:   12/25/21 1103  BP: (!) 138/93  Pulse: 92  SpO2: 96%  Weight: 180 lb (81.6 kg)  Height: 5\' 5"  (1.651 m)    GEN- The patient is well appearing, alert and oriented x 3 today.   HEENT: normocephalic, atraumatic; sclera clear, conjunctiva pink; hearing intact; oropharynx clear; neck supple, no JVP Lymph- no cervical lymphadenopathy Lungs- Clear to ausculation bilaterally, normal work of breathing.  No wheezes, rales, rhonchi Heart- Regular rate and rhythm, no murmurs, rubs or gallops, PMI not laterally displaced GI- soft, non-tender, non-distended, bowel sounds present, no hepatosplenomegaly Extremities- no clubbing, cyanosis, or edema; DP/PT/radial pulses 2+ bilaterally MS- no significant deformity or atrophy Skin- warm and dry, no rash or lesion Psych- euthymic mood, full affect Neuro- strength and sensation are intact  EKG is ordered. Personal review of EKG from today shows atrial fibrillation at 92 bpm  Additional studies reviewed include: Previous EP office notes.   Assessment and Plan:  1.  Paroxysmal atrial fibrillation/flutter:  Currently on metoprolol, diltiazem, Eliquis.  CHA2DS2-VASc of 4.  EKG today shows AF at 92 bpm. She is asymptomatic.  She has had occasional SVT.  She takes as needed metoprolol and is comfortable with her overall control. She does not wish to make changes at this time.  Will have her follow up with AF clinic in one month to further assess the persistence of her AF and symptoms. She is not interested in ablation consideration per our discussion. Labs today.   2. HTN Stable on current regimen  3. Obesity Body mass index is 29.95 kg/m.     Follow up with AF clinic in 1 month, then Dr. Curt Bears in  9 months.    Shirley Friar, PA-C   12/25/21 11:09 AM

## 2021-12-25 ENCOUNTER — Encounter (INDEPENDENT_AMBULATORY_CARE_PROVIDER_SITE_OTHER): Payer: Self-pay

## 2021-12-25 ENCOUNTER — Ambulatory Visit (INDEPENDENT_AMBULATORY_CARE_PROVIDER_SITE_OTHER): Payer: Medicare Other | Admitting: Student

## 2021-12-25 ENCOUNTER — Encounter: Payer: Self-pay | Admitting: Student

## 2021-12-25 ENCOUNTER — Other Ambulatory Visit: Payer: Self-pay

## 2021-12-25 VITALS — BP 138/93 | HR 92 | Ht 65.0 in | Wt 180.0 lb

## 2021-12-25 DIAGNOSIS — I10 Essential (primary) hypertension: Secondary | ICD-10-CM | POA: Diagnosis not present

## 2021-12-25 DIAGNOSIS — I480 Paroxysmal atrial fibrillation: Secondary | ICD-10-CM | POA: Diagnosis not present

## 2021-12-25 LAB — CBC
Hematocrit: 41.3 % (ref 34.0–46.6)
Hemoglobin: 13.7 g/dL (ref 11.1–15.9)
MCH: 30.6 pg (ref 26.6–33.0)
MCHC: 33.2 g/dL (ref 31.5–35.7)
MCV: 92 fL (ref 79–97)
Platelets: 481 10*3/uL — ABNORMAL HIGH (ref 150–450)
RBC: 4.47 x10E6/uL (ref 3.77–5.28)
RDW: 11.9 % (ref 11.7–15.4)
WBC: 11 10*3/uL — ABNORMAL HIGH (ref 3.4–10.8)

## 2021-12-25 LAB — BASIC METABOLIC PANEL
BUN/Creatinine Ratio: 16 (ref 12–28)
BUN: 14 mg/dL (ref 8–27)
CO2: 22 mmol/L (ref 20–29)
Calcium: 9.7 mg/dL (ref 8.7–10.3)
Chloride: 101 mmol/L (ref 96–106)
Creatinine, Ser: 0.9 mg/dL (ref 0.57–1.00)
Glucose: 94 mg/dL (ref 70–99)
Potassium: 4.3 mmol/L (ref 3.5–5.2)
Sodium: 138 mmol/L (ref 134–144)
eGFR: 65 mL/min/{1.73_m2} (ref >59)

## 2021-12-25 NOTE — Patient Instructions (Signed)
Medication Instructions:  Your physician recommends that you continue on your current medications as directed. Please refer to the Current Medication list given to you today.  *If you need a refill on your cardiac medications before your next appointment, please call your pharmacy*   Lab Work: TODAY: BMET, CBC  If you have labs (blood work) drawn today and your tests are completely normal, you will receive your results only by: [place] (if you have MyChart) OR A paper copy in the mail If you have any lab test that is abnormal or we need to change your treatment, we will call you to review the results.   Follow-Up: At [facility], you and your health needs are our priority.  As part of our continuing mission to provide you with exceptional heart care, we have created designated Provider Care Teams.  These Care Teams include your primary Cardiologist (physician) and Advanced Practice Providers (APPs -  Physician Assistants and Nurse Practitioners) who all work together to provide you with the care you need, when you need it.  We recommend signing up for the patient portal called "MyChart".  Sign up information is provided on this After Visit Summary.  MyChart is used to connect with patients for Virtual Visits (Telemedicine).  Patients are able to view lab/test results, encounter notes, upcoming appointments, etc.  Non-urgent messages can be sent to your provider as well.   To learn more about what you can do with MyChart, go to [email or web address].    Your next appointment:   9 month(s)  The format for your next appointment:   In Person  Provider:   You may see Will Meredith Leeds, MD or one of the following Advanced Practice Providers on your designated Care Team:   Tommye Standard, [place] Legrand Como "[facility]" [place], [place]

## 2022-01-08 DIAGNOSIS — M85851 Other specified disorders of bone density and structure, right thigh: Secondary | ICD-10-CM | POA: Diagnosis not present

## 2022-01-08 DIAGNOSIS — Z853 Personal history of malignant neoplasm of breast: Secondary | ICD-10-CM | POA: Diagnosis not present

## 2022-01-08 DIAGNOSIS — M85852 Other specified disorders of bone density and structure, left thigh: Secondary | ICD-10-CM | POA: Diagnosis not present

## 2022-01-22 ENCOUNTER — Ambulatory Visit (HOSPITAL_COMMUNITY): Payer: Medicare Other | Admitting: Physician Assistant

## 2022-01-24 ENCOUNTER — Other Ambulatory Visit: Payer: Self-pay | Admitting: Cardiology

## 2022-01-24 NOTE — Telephone Encounter (Signed)
Prescription refill request for Eliquis received. Indication: Afib  Last office visit:12/25/21([place])  Scr:0.90 (12/25/21)  Age: 81 Weight: 81.6kg  Appropriate dose and refill sent to requested pharmacy.

## 2022-01-30 ENCOUNTER — Ambulatory Visit (HOSPITAL_COMMUNITY): Payer: Medicare Other | Admitting: Physician Assistant

## 2022-01-30 ENCOUNTER — Encounter (HOSPITAL_COMMUNITY): Payer: Self-pay

## 2022-04-24 ENCOUNTER — Encounter (HOSPITAL_COMMUNITY): Payer: Self-pay | Admitting: Physician Assistant

## 2022-04-24 ENCOUNTER — Ambulatory Visit (HOSPITAL_COMMUNITY)
Admission: RE | Admit: 2022-04-24 | Discharge: 2022-04-24 | Disposition: A | Discharge status: Home / Self Care | Payer: Medicare Other | Source: Ambulatory Visit | Attending: Physician Assistant | Admitting: Physician Assistant

## 2022-04-24 ENCOUNTER — Encounter (HOSPITAL_COMMUNITY): Payer: Self-pay

## 2022-04-24 VITALS — BP 116/88 | HR 79 | Ht 65.0 in | Wt 185.0 lb

## 2022-04-24 DIAGNOSIS — E669 Obesity, unspecified: Secondary | ICD-10-CM | POA: Diagnosis not present

## 2022-04-24 DIAGNOSIS — I4892 Unspecified atrial flutter: Secondary | ICD-10-CM | POA: Insufficient documentation

## 2022-04-24 DIAGNOSIS — I4819 Other persistent atrial fibrillation: Secondary | ICD-10-CM | POA: Insufficient documentation

## 2022-04-24 DIAGNOSIS — Z7901 Long term (current) use of anticoagulants: Secondary | ICD-10-CM | POA: Insufficient documentation

## 2022-04-24 DIAGNOSIS — C50919 Malignant neoplasm of unspecified site of unspecified female breast: Secondary | ICD-10-CM | POA: Diagnosis not present

## 2022-04-24 DIAGNOSIS — D6869 Other thrombophilia: Secondary | ICD-10-CM | POA: Diagnosis not present

## 2022-04-24 DIAGNOSIS — I10 Essential (primary) hypertension: Secondary | ICD-10-CM | POA: Insufficient documentation

## 2022-04-24 DIAGNOSIS — I471 Supraventricular tachycardia: Secondary | ICD-10-CM | POA: Insufficient documentation

## 2022-04-24 DIAGNOSIS — E785 Hyperlipidemia, unspecified: Secondary | ICD-10-CM | POA: Diagnosis not present

## 2022-04-24 DIAGNOSIS — Z6830 Body mass index (BMI) 30.0-30.9, adult: Secondary | ICD-10-CM | POA: Diagnosis not present

## 2022-04-24 LAB — BASIC METABOLIC PANEL
Anion gap: 11 (ref 5–15)
BUN: 15 mg/dL (ref 8–23)
CO2: 22 mmol/L (ref 22–32)
Calcium: 9.4 mg/dL (ref 8.9–10.3)
Chloride: 102 mmol/L (ref 98–111)
Creatinine, Ser: 1.07 mg/dL — ABNORMAL HIGH (ref 0.44–1.00)
GFR, Estimated: 53 mL/min — ABNORMAL LOW (ref >60)
Glucose, Bld: 96 mg/dL (ref 70–99)
Potassium: 4.2 mmol/L (ref 3.5–5.1)
Sodium: 135 mmol/L (ref 135–145)

## 2022-04-24 LAB — CBC
HCT: 39.7 % (ref 36.0–46.0)
Hemoglobin: 12.7 g/dL (ref 12.0–15.0)
MCH: 29.7 pg (ref 26.0–34.0)
MCHC: 32 g/dL (ref 30.0–36.0)
MCV: 92.8 fL (ref 80.0–100.0)
Platelets: 368 10*3/uL (ref 150–400)
RBC: 4.28 MIL/uL (ref 3.87–5.11)
RDW: 13.6 % (ref 11.5–15.5)
WBC: 9.4 10*3/uL (ref 4.0–10.5)
nRBC: 0 % (ref 0.0–0.2)

## 2022-04-24 NOTE — Progress Notes (Signed)
? ? ?Primary Care Physician: Maury Dus, MD ?Primary Cardiologist: none ?Primary Electrophysiologist: Dr Curt Bears  ?Referring Physician: [facility]/Dr Alyson Ingles ? ? ?Erin Gillespie is a 81 y.o. female with a history of HTN, breast cancer, HLD, SVT, and paroxysmal atrial fibrillation who presents for follow up in the [facility].  The patient was initially diagnosed with atrial fibrillation on 08/25/19 after presenting to the ER with symptoms of an irregular heart beat. She converted to SR after 5 mg of Lopressor and was started on Eliquis. Of note, she had just finished a course of antibiotics for a surgical skin infection. She was also incidentally found to have a UTI at the ER and started on antibiotics.  Since then, she has had heart racing off and on intermittently. She states this has been going on for quite some time. She had a 30 day event monitor in 2019 which showed SVT episodes which correlated with her symptoms.  ? ?On follow up today, patient reports that she feels well today. She is surprised to hear she is in afib today. Suspect she has been persistently in afib. Her heart rate will occassionally get over 100 bpm and she will take a PRN BB. No bleeding issues on anticoagulation.  ? ?Today, she denies symptoms of palpitations, chest pain, shortness of breath, orthopnea, PND, lower extremity edema, dizziness, presyncope, syncope, snoring, daytime somnolence, bleeding, or neurologic sequela. The patient is tolerating medications without difficulties and is otherwise without complaint today.  ? ? ?Atrial Fibrillation Risk Factors: ? ?she does not have symptoms or diagnosis of sleep apnea. ?she does not have a history of alcohol use. ? ? ?she has a BMI of Body mass index is 30.79 kg/m?Marland KitchenMarland Kitchen ?Filed Weights  ? 04/24/22 1121  ?Weight: 83.9 kg  ? ? ? ?Family History  ?Problem Relation Age of Onset  ? Cancer Brother   ? ? ? ?Atrial Fibrillation Management history: ? ?Previous  antiarrhythmic drugs: none ?Previous cardioversions: none ?Previous ablations: none ?CHADS2VASC score: 4 ?Anticoagulation history: Eliquis ? ? ?Past Medical History:  ?Diagnosis Date  ? Anxiety   ? Breast cancer ([place])   ? Cancer [facility])   ? Heart murmur   ? echo confirmed over 5+ years ago.   ? Hypercholesterolemia   ? Hypertension   ? ?Past Surgical History:  ?Procedure Laterality Date  ? CATARACT EXTRACTION, BILATERAL Bilateral 2003  ? approx 2003  ? CHOLECYSTECTOMY    ? CHOLECYSTECTOMY, LAPAROSCOPIC  1990  ? SIMPLE MASTECTOMY WITH AXILLARY SENTINEL NODE BIOPSY Left 10/12/2018  ? Procedure: LEFT  MASTECTOMY WITH  SENTINEL NODE BIOPSY;  Surgeon: Jovita Kussmaul, MD;  Location: [facility];  Service: General;  Laterality: Left;  ? ? ?Current Outpatient Medications  ?Medication Sig Dispense Refill  ? acetaminophen (TYLENOL) 650 MG CR tablet as needed.    ? alendronate (FOSAMAX) 70 MG tablet TAKE 1 TABLET BY MOUTH ONCE A WEEK FOR 90 DAYS    ? ALPRAZolam (XANAX) 0.5 MG tablet Take 0.5 mg by mouth at bedtime as needed. 1/2-1  tablet BID as need for anxiety/sleep    ? apixaban (ELIQUIS) 5 MG TABS tablet TAKE 1 TABLET BY MOUTH TWICE DAILY . APPOINTMENT REQUIRED FOR FUTURE REFILLS 180 tablet 1  ? Ascorbic Acid (VITAMIN C) 1000 MG tablet daily at 6 (six) AM.    ? calcium carbonate (OSCAL) 1500 (600 Ca) MG TABS tablet daily at 6 (six) AM.    ? cholecalciferol (VITAMIN D) 1000 units tablet  Take 1,000 Units by mouth daily with lunch.    ? diltiazem (TIAZAC) 300 MG 24 hr capsule Take 300 mg by mouth daily.    ? DULoxetine (CYMBALTA) 60 MG capsule TAKE 2 CAPSULES BY MOUTH IN THE EVENING ON A FULL STOMACH WITH HIGH PROTEIN CONTENT    ? Ferrous Sulfate (SLOW FE) 142 (45 Fe) MG TBCR daily.    ? letrozole (FEMARA) 2.5 MG tablet Take 1 tablet (2.5 mg total) by mouth daily. 90 tablet 3  ? methocarbamol (ROBAXIN) 500 MG tablet Take 1 tablet (500 mg total) by mouth every 6 (six) hours as needed for muscle spasms. 20 tablet 1  ? metoprolol  tartrate (LOPRESSOR) 25 MG tablet TAKE 1 TABLET BY MOUTH EVERY 6 HOURS AS NEEDED FOR AFIB HOUR OVER 100 135 tablet 1  ? Multiple Vitamin (MULTIVITAMIN WITH MINERALS) TABS tablet Take 1 tablet by mouth daily with lunch.    ? Omega-3 Fatty Acids (FISH OIL) 1000 MG CAPS Take 1,000 mg by mouth daily with lunch.    ? pravastatin (PRAVACHOL) 40 MG tablet Take 40 mg by mouth every evening.     ? spironolactone (ALDACTONE) 25 MG tablet Take 25 mg by mouth 2 (two) times daily.     ? ?No current facility-administered medications for this encounter.  ? ? ?Allergies  ?Allergen Reactions  ? Clonidine Hcl   ?  Other reaction(s): unknown  ? Lisinopril   ?  Other reaction(s): unknown  ? Nifedipine   ?  Other reaction(s): unknown  ? Terazosin   ?  Other reaction(s): unknown  ? Codeine Nausea And Vomiting and Rash  ? Sulfa Antibiotics Rash  ? ? ?Social History  ? ?Socioeconomic History  ? Marital status: Widowed  ?  Spouse name: Not on file  ? Number of children: Not on file  ? Years of education: Not on file  ? Highest education level: Not on file  ?Occupational History  ? Not on file  ?Tobacco Use  ? Smoking status: Never  ? Smokeless tobacco: Never  ? Tobacco comments:  ?  Never smoke 04/24/22  ?Vaping Use  ? Vaping Use: Never used  ?Substance and Sexual Activity  ? Alcohol use: Yes  ?  Alcohol/week: 2.0 standard drinks  ?  Types: 2 Shots of liquor per week  ?  Comment: occasional   ? Drug use: No  ? Sexual activity: Not Currently  ?Other Topics Concern  ? Not on file  ?Social History Narrative  ? Not on file  ? ?Social Determinants of Health  ? ?Financial Resource Strain: Not on file  ?Food Insecurity: Not on file  ?Transportation Needs: Not on file  ?Physical Activity: Not on file  ?Stress: Not on file  ?Social Connections: Not on file  ?Intimate Partner Violence: Not on file  ? ? ? ?ROS- All systems are reviewed and negative except as per the HPI above. ? ?Physical Exam: ?Vitals:  ? 04/24/22 1121  ?BP: 116/88  ?Pulse: 79   ?Weight: 83.9 kg  ?Height: '5\' 5"'$  (1.651 m)  ? ? ? ?GEN- The patient is a well appearing elderly female, alert and oriented x 3 today.   ?HEENT-head normocephalic, atraumatic, sclera clear, conjunctiva pink, hearing intact, trachea midline. ?Lungs- Clear to ausculation bilaterally, normal work of breathing ?Heart- irregular rate and rhythm, no murmurs, rubs or gallops  ?GI- soft, NT, ND, + BS ?Extremities- no clubbing, cyanosis, or edema ?MS- no significant deformity or atrophy ?Skin- no rash or lesion ?Psych-  euthymic mood, full affect ?Neuro- strength and sensation are intact ? ? ?Wt Readings from Last 3 Encounters:  ?04/24/22 83.9 kg  ?12/25/21 81.6 kg  ?10/04/21 83.4 kg  ? ? ?EKG today demonstrates  ?Afib ?Vent. rate 79 BPM ?PR interval * ms ?QRS duration 78 ms ?QT/QTcB 376/431 ms ? ?Echo 07/13/20 ? 1. Left ventricular ejection fraction, by estimation, is 60 to 65%. The  ?left ventricle has normal function. The left ventricle has no regional  ?wall motion abnormalities. Left ventricular diastolic parameters are  ?consistent with Grade II diastolic dysfunction (pseudonormalization). The average left ventricular global longitudinal strain is 19.7 %. The global longitudinal strain is normal.  ? 2. Right ventricular systolic function is normal. The right ventricular  ?size is normal. There is normal pulmonary artery systolic pressure. The  ?estimated right ventricular systolic pressure is 29.0 mmHg.  ? 3. The mitral valve is normal in structure. Trivial mitral valve  ?regurgitation. No evidence of mitral stenosis.  ? 4. The aortic valve is tricuspid. Aortic valve regurgitation is mild.  ?Mild aortic valve sclerosis is present, with no evidence of aortic valve  ?stenosis.  ? 5. The inferior vena cava is normal in size with greater than 50%  ?respiratory variability, suggesting right atrial pressure of 3 mmHg.  ? ?Epic records are reviewed at length today ? ?CHA2DS2-VASc Score = 4  ?The patient's score is based  upon: ?CHF History: 0 ?HTN History: 1 ?Diabetes History: 0 ?Stroke History: 0 ?Vascular Disease History: 0 ?Age Score: 2 ?Gender Score: 1 ?    ? ? ?ASSESSMENT AND PLAN: ?1. Persistent Atrial Fibrillation/atrial flutter/SVT ?[facility]

## 2022-04-24 NOTE — H&P (View-Only) (Signed)
Primary Care Physician: Maury Dus, MD Primary Cardiologist: none Primary Electrophysiologist: Dr Curt Bears  Referring Physician: [facility]/Dr Diamantina Monks Erin Gillespie is a 81 y.o. female with a history of HTN, breast cancer, HLD, SVT, and paroxysmal atrial fibrillation who presents for follow up in the [facility].  The patient was initially diagnosed with atrial fibrillation on 08/25/19 after presenting to the ER with symptoms of an irregular heart beat. She converted to SR after 5 mg of Lopressor and was started on Eliquis. Of note, she had just finished a course of antibiotics for a surgical skin infection. She was also incidentally found to have a UTI at the ER and started on antibiotics.  Since then, she has had heart racing off and on intermittently. She states this has been going on for quite some time. She had a 30 day event monitor in 2019 which showed SVT episodes which correlated with her symptoms.   On follow up today, patient reports that she feels well today. She is surprised to hear she is in afib today. Suspect she has been persistently in afib. Her heart rate will occassionally get over 100 bpm and she will take a PRN BB. No bleeding issues on anticoagulation.   Today, she denies symptoms of palpitations, chest pain, shortness of breath, orthopnea, PND, lower extremity edema, dizziness, presyncope, syncope, snoring, daytime somnolence, bleeding, or neurologic sequela. The patient is tolerating medications without difficulties and is otherwise without complaint today.    Atrial Fibrillation Risk Factors:  she does not have symptoms or diagnosis of sleep apnea. she does not have a history of alcohol use.   she has a BMI of Body mass index is 30.79 kg/m.Marland Kitchen Filed Weights   04/24/22 1121  Weight: 83.9 kg     Family History  Problem Relation Age of Onset   Cancer Brother      Atrial Fibrillation Management history:  Previous  antiarrhythmic drugs: none Previous cardioversions: none Previous ablations: none CHADS2VASC score: 4 Anticoagulation history: Eliquis   Past Medical History:  Diagnosis Date   Anxiety    Breast cancer ([place])    Cancer ([place])    Heart murmur    echo confirmed over 5+ years ago.    Hypercholesterolemia    Hypertension    Past Surgical History:  Procedure Laterality Date   CATARACT EXTRACTION, BILATERAL Bilateral 2003   approx 2003   CHOLECYSTECTOMY     CHOLECYSTECTOMY, LAPAROSCOPIC  1990   SIMPLE MASTECTOMY WITH AXILLARY SENTINEL NODE BIOPSY Left 10/12/2018   Procedure: LEFT  MASTECTOMY WITH  SENTINEL NODE BIOPSY;  Surgeon: Jovita Kussmaul, MD;  Location: [facility];  Service: General;  Laterality: Left;    Current Outpatient Medications  Medication Sig Dispense Refill   acetaminophen (TYLENOL) 650 MG CR tablet as needed.     alendronate (FOSAMAX) 70 MG tablet TAKE 1 TABLET BY MOUTH ONCE A WEEK FOR 90 DAYS     ALPRAZolam (XANAX) 0.5 MG tablet Take 0.5 mg by mouth at bedtime as needed. 1/2-1  tablet BID as need for anxiety/sleep     apixaban (ELIQUIS) 5 MG TABS tablet TAKE 1 TABLET BY MOUTH TWICE DAILY . APPOINTMENT REQUIRED FOR FUTURE REFILLS 180 tablet 1   Ascorbic Acid (VITAMIN C) 1000 MG tablet daily at 6 (six) AM.     calcium carbonate (OSCAL) 1500 (600 Ca) MG TABS tablet daily at 6 (six) AM.     cholecalciferol (VITAMIN D) 1000 units tablet  Take 1,000 Units by mouth daily with lunch.     diltiazem (TIAZAC) 300 MG 24 hr capsule Take 300 mg by mouth daily.     DULoxetine (CYMBALTA) 60 MG capsule TAKE 2 CAPSULES BY MOUTH IN THE EVENING ON A FULL STOMACH WITH HIGH PROTEIN CONTENT     Ferrous Sulfate (SLOW FE) 142 (45 Fe) MG TBCR daily.     letrozole (FEMARA) 2.5 MG tablet Take 1 tablet (2.5 mg total) by mouth daily. 90 tablet 3   methocarbamol (ROBAXIN) 500 MG tablet Take 1 tablet (500 mg total) by mouth every 6 (six) hours as needed for muscle spasms. 20 tablet 1   metoprolol  tartrate (LOPRESSOR) 25 MG tablet TAKE 1 TABLET BY MOUTH EVERY 6 HOURS AS NEEDED FOR AFIB HOUR OVER 100 135 tablet 1   Multiple Vitamin (MULTIVITAMIN WITH MINERALS) TABS tablet Take 1 tablet by mouth daily with lunch.     Omega-3 Fatty Acids (FISH OIL) 1000 MG CAPS Take 1,000 mg by mouth daily with lunch.     pravastatin (PRAVACHOL) 40 MG tablet Take 40 mg by mouth every evening.      spironolactone (ALDACTONE) 25 MG tablet Take 25 mg by mouth 2 (two) times daily.      No current facility-administered medications for this encounter.    Allergies  Allergen Reactions   Clonidine Hcl     Other reaction(s): unknown   Lisinopril     Other reaction(s): unknown   Nifedipine     Other reaction(s): unknown   Terazosin     Other reaction(s): unknown   Codeine Nausea And Vomiting and Rash   Sulfa Antibiotics Rash    Social History   Socioeconomic History   Marital status: Widowed    Spouse name: Not on file   Number of children: Not on file   Years of education: Not on file   Highest education level: Not on file  Occupational History   Not on file  Tobacco Use   Smoking status: Never   Smokeless tobacco: Never   Tobacco comments:    Never smoke 04/24/22  Vaping Use   Vaping Use: Never used  Substance and Sexual Activity   Alcohol use: Yes    Alcohol/week: 2.0 standard drinks    Types: 2 Shots of liquor per week    Comment: occasional    Drug use: No   Sexual activity: Not Currently  Other Topics Concern   Not on file  Social History Narrative   Not on file   Social Determinants of Health   Financial Resource Strain: Not on file  Food Insecurity: Not on file  Transportation Needs: Not on file  Physical Activity: Not on file  Stress: Not on file  Social Connections: Not on file  Intimate Partner Violence: Not on file     ROS- All systems are reviewed and negative except as per the HPI above.  Physical Exam: Vitals:   04/24/22 1121  BP: 116/88  Pulse: 79   Weight: 83.9 kg  Height: '5\' 5"'$  (1.651 m)     GEN- The patient is a well appearing elderly female, alert and oriented x 3 today.   HEENT-head normocephalic, atraumatic, sclera clear, conjunctiva pink, hearing intact, trachea midline. Lungs- Clear to ausculation bilaterally, normal work of breathing Heart- irregular rate and rhythm, no murmurs, rubs or gallops  GI- soft, NT, ND, + BS Extremities- no clubbing, cyanosis, or edema MS- no significant deformity or atrophy Skin- no rash or lesion Psych-  euthymic mood, full affect Neuro- strength and sensation are intact   Wt Readings from Last 3 Encounters:  04/24/22 83.9 kg  12/25/21 81.6 kg  10/04/21 83.4 kg    EKG today demonstrates  Afib Vent. rate 79 BPM PR interval * ms QRS duration 78 ms QT/QTcB 376/431 ms  Echo 07/13/20  1. Left ventricular ejection fraction, by estimation, is 60 to 65%. The  left ventricle has normal function. The left ventricle has no regional  wall motion abnormalities. Left ventricular diastolic parameters are  consistent with Grade II diastolic dysfunction (pseudonormalization). The average left ventricular global longitudinal strain is 19.7 %. The global longitudinal strain is normal.   2. Right ventricular systolic function is normal. The right ventricular  size is normal. There is normal pulmonary artery systolic pressure. The  estimated right ventricular systolic pressure is 37.6 mmHg.   3. The mitral valve is normal in structure. Trivial mitral valve  regurgitation. No evidence of mitral stenosis.   4. The aortic valve is tricuspid. Aortic valve regurgitation is mild.  Mild aortic valve sclerosis is present, with no evidence of aortic valve  stenosis.   5. The inferior vena cava is normal in size with greater than 50%  respiratory variability, suggesting right atrial pressure of 3 mmHg.   Epic records are reviewed at length today  CHA2DS2-VASc Score = 4  The patient's score is based  upon: CHF History: 0 HTN History: 1 Diabetes History: 0 Stroke History: 0 Vascular Disease History: 0 Age Score: 2 Gender Score: 1       ASSESSMENT AND PLAN: 1. Persistent Atrial Fibrillation/atrial flutter/SVT The patient's CHA2DS2-VASc score is 4, indicating a 4.8% annual risk of stroke.   Patient appears to be persistently in afib. We discussed rate vs rhythm control today. Patient and family would like a trial of SR to see if she has any symptomatic improvement. Will arrange for DCCV. She may eventually opt for rate control given her age and paucity of symptoms.  Continue Lopressor 25 mg PRN q6hrs for heart racing. Continue diltiazem 300 mg daily Continue Eliquis 5 mg BID she denies any missed doses in the past 3 weeks.  We discussed [employer] mobile for home monitoring.   2. Secondary Hypercoagulable State (ICD10:  D68.69) The patient is at significant risk for stroke/thromboembolism based upon her CHA2DS2-VASc Score of 4.  Continue Apixaban (Eliquis).   3. Obesity Body mass index is 30.79 kg/m. Lifestyle modification was discussed and encouraged including regular physical activity and weight reduction.  4. HTN Stable, no changes today.   Follow up in the AF clinic post DCCV.    [facility] [address] [place], [place] [address] [phone] 04/24/2022 11:29 AM

## 2022-04-24 NOTE — Patient Instructions (Signed)
Cardioversion scheduled for Thursday, June 1st ? - Arrive at the [facility] and go to admitting at 930am ? - Do not eat or drink anything after midnight the night prior to your procedure. ? - Take all your morning medication (except diabetic medications) with a sip of water prior to arrival. ? - You will not be able to drive home after your procedure. ? - Do NOT miss any doses of your blood thinner - if you should miss a dose please notify our office immediately. ? - If you feel as if you go back into normal rhythm prior to scheduled cardioversion, please notify our office immediately. If your procedure is canceled in the cardioversion suite you will be charged a cancellation fee. ? ?

## 2022-04-30 ENCOUNTER — Encounter (HOSPITAL_COMMUNITY): Payer: Self-pay | Admitting: Internal Medicine

## 2022-05-09 ENCOUNTER — Encounter (HOSPITAL_COMMUNITY): Payer: Self-pay | Admitting: Internal Medicine

## 2022-05-09 ENCOUNTER — Other Ambulatory Visit: Payer: Self-pay

## 2022-05-09 ENCOUNTER — Ambulatory Visit (HOSPITAL_BASED_OUTPATIENT_CLINIC_OR_DEPARTMENT_OTHER): Payer: Medicare Other | Admitting: Anesthesiology

## 2022-05-09 ENCOUNTER — Encounter (HOSPITAL_COMMUNITY)
Admission: RE | Disposition: A | Discharge status: Home / Self Care | Payer: Self-pay | Source: Home / Self Care | Attending: Internal Medicine

## 2022-05-09 ENCOUNTER — Ambulatory Visit (HOSPITAL_COMMUNITY)
Admission: RE | Admit: 2022-05-09 | Discharge: 2022-05-09 | Disposition: A | Discharge status: Home / Self Care | Payer: Medicare Other | Attending: Internal Medicine | Admitting: Internal Medicine

## 2022-05-09 ENCOUNTER — Ambulatory Visit (HOSPITAL_COMMUNITY): Payer: Medicare Other | Admitting: Anesthesiology

## 2022-05-09 DIAGNOSIS — Z6830 Body mass index (BMI) 30.0-30.9, adult: Secondary | ICD-10-CM | POA: Diagnosis not present

## 2022-05-09 DIAGNOSIS — Z7901 Long term (current) use of anticoagulants: Secondary | ICD-10-CM | POA: Insufficient documentation

## 2022-05-09 DIAGNOSIS — I10 Essential (primary) hypertension: Secondary | ICD-10-CM

## 2022-05-09 DIAGNOSIS — Z79899 Other long term (current) drug therapy: Secondary | ICD-10-CM | POA: Insufficient documentation

## 2022-05-09 DIAGNOSIS — F419 Anxiety disorder, unspecified: Secondary | ICD-10-CM | POA: Insufficient documentation

## 2022-05-09 DIAGNOSIS — I351 Nonrheumatic aortic (valve) insufficiency: Secondary | ICD-10-CM | POA: Diagnosis not present

## 2022-05-09 DIAGNOSIS — D759 Disease of blood and blood-forming organs, unspecified: Secondary | ICD-10-CM | POA: Diagnosis not present

## 2022-05-09 DIAGNOSIS — I4892 Unspecified atrial flutter: Secondary | ICD-10-CM | POA: Diagnosis not present

## 2022-05-09 DIAGNOSIS — I4819 Other persistent atrial fibrillation: Secondary | ICD-10-CM

## 2022-05-09 DIAGNOSIS — D6869 Other thrombophilia: Secondary | ICD-10-CM | POA: Diagnosis not present

## 2022-05-09 DIAGNOSIS — I4891 Unspecified atrial fibrillation: Secondary | ICD-10-CM

## 2022-05-09 HISTORY — PX: CARDIOVERSION: SHX1299

## 2022-05-09 SURGERY — CARDIOVERSION
Anesthesia: General

## 2022-05-09 MED ORDER — LIDOCAINE 2% (20 MG/ML) 5 ML SYRINGE
INTRAMUSCULAR | Status: DC | PRN
  Administered 2022-05-09: 60 mg via INTRAVENOUS

## 2022-05-09 MED ORDER — METOPROLOL SUCCINATE ER 25 MG PO TB24: 25.0000 mg | ORAL_TABLET | Freq: Once | ORAL | Status: DC

## 2022-05-09 MED ORDER — ESMOLOL HCL 100 MG/10ML IV SOLN
INTRAVENOUS | Status: DC | PRN
  Administered 2022-05-09: 30 mg via INTRAVENOUS

## 2022-05-09 MED ORDER — SODIUM CHLORIDE 0.9 % IV SOLN: INTRAVENOUS | Status: DC

## 2022-05-09 MED ORDER — PROPOFOL 10 MG/ML IV BOLUS
INTRAVENOUS | Status: DC | PRN
  Administered 2022-05-09: 50 mg via INTRAVENOUS

## 2022-05-09 NOTE — Anesthesia Preprocedure Evaluation (Addendum)
Anesthesia Evaluation  Patient identified by MRN, date of birth, ID band Patient awake    Reviewed: Allergy & Precautions, NPO status , Patient's Chart, lab work & pertinent test results, reviewed documented beta blocker date and time   Airway Mallampati: III  TM Distance: >3 FB Neck ROM: Full  Mouth opening: Limited Mouth Opening  Dental  (+) Dental Advisory Given, Missing, Chipped, Upper Dentures,    Pulmonary neg pulmonary ROS,    Pulmonary exam normal breath sounds clear to auscultation       Cardiovascular hypertension, Pt. on home beta blockers and Pt. on medications Normal cardiovascular exam+ dysrhythmias Atrial Fibrillation  Rhythm:Irregular Rate:Normal  TTE 2021 1. Left ventricular ejection fraction, by estimation, is 60 to 65%. The  left ventricle has normal function. The left ventricle has no regional  wall motion abnormalities. Left ventricular diastolic parameters are  consistent with Grade II diastolic  dysfunction (pseudonormalization). The average left ventricular global  longitudinal strain is 19.7 %. The global longitudinal strain is normal.  2. Right ventricular systolic function is normal. The right ventricular  size is normal. There is normal pulmonary artery systolic pressure. The  estimated right ventricular systolic pressure is 21.2 mmHg.  3. The mitral valve is normal in structure. Trivial mitral valve  regurgitation. No evidence of mitral stenosis.  4. The aortic valve is tricuspid. Aortic valve regurgitation is mild.  Mild aortic valve sclerosis is present, with no evidence of aortic valve  stenosis.  5. The inferior vena cava is normal in size with greater than 50%  respiratory variability, suggesting right atrial pressure of 3 mmHg.    Neuro/Psych PSYCHIATRIC DISORDERS Anxiety negative neurological ROS     GI/Hepatic negative GI ROS, Neg liver ROS,   Endo/Other  negative endocrine ROS   Renal/GU negative Renal ROS  negative genitourinary   Musculoskeletal negative musculoskeletal ROS (+)   Abdominal   Peds  Hematology  (+) Blood dyscrasia (eliquis, no missed doses), ,   Anesthesia Other Findings   Reproductive/Obstetrics                           Anesthesia Physical Anesthesia Plan  ASA: 3  Anesthesia Plan: General   Post-op Pain Management:    Induction: Intravenous  PONV Risk Score and Plan: Propofol infusion and Treatment may vary due to age or medical condition  Airway Management Planned: Natural Airway  Additional Equipment:   Intra-op Plan:   Post-operative Plan:   Informed Consent: I have reviewed the patients History and Physical, chart, labs and discussed the procedure including the risks, benefits and alternatives for the proposed anesthesia with the patient or authorized representative who has indicated his/her understanding and acceptance.     Dental advisory given  Plan Discussed with: CRNA  Anesthesia Plan Comments:         Anesthesia Quick Evaluation

## 2022-05-09 NOTE — Discharge Instructions (Signed)

## 2022-05-09 NOTE — Interval H&P Note (Signed)
History and Physical Interval Note:  05/09/2022 9:53 AM  Erin Gillespie  has presented today for surgery, with the diagnosis of AFIB C.  The various methods of treatment have been discussed with the patient and family. After consideration of risks, benefits and other options for treatment, the patient has consented to  Procedure(s): CARDIOVERSION (N/A) as a surgical intervention.  The patient's history has been reviewed, patient examined, no change in status, stable for surgery.  I have reviewed the patient's chart and labs.  Questions were answered to the patient's satisfaction.     Janina Mayo

## 2022-05-09 NOTE — Anesthesia Postprocedure Evaluation (Signed)
Anesthesia Post Note  Patient: Erin Gillespie  Procedure(s) Performed: CARDIOVERSION     Patient location during evaluation: Endoscopy Anesthesia Type: General Level of consciousness: awake and alert Pain management: pain level controlled Vital Signs Assessment: post-procedure vital signs reviewed and stable Respiratory status: spontaneous breathing, nonlabored ventilation, respiratory function stable and patient connected to nasal cannula oxygen Cardiovascular status: blood pressure returned to baseline and stable Postop Assessment: no apparent nausea or vomiting Anesthetic complications: no   No notable events documented.  Last Vitals:  Vitals:   05/09/22 1056 05/09/22 1100  BP: 101/76 93/76  Pulse: (!) 52 (!) 142  Resp: 19 17  Temp:    SpO2: 94% 96%    Last Pain:  Vitals:   05/09/22 1035  TempSrc:   PainSc: 0-No pain                 Aston Lawhorn L Ivonna Kinnick

## 2022-05-09 NOTE — Op Note (Signed)
Procedure: Electrical Cardioversion Indications:  Atrial Fibrillation  Procedure Details:  Consent: Risks of procedure as well as the alternatives and risks of each were explained to the (patient/caregiver).  Consent for procedure obtained.  Time Out: Verified patient identification, verified procedure, site/side was marked, verified correct patient position, special equipment/implants available, medications/allergies/relevent history reviewed, required imaging and test results available. PERFORMED.  Patient placed on cardiac monitor, pulse oximetry, supplemental oxygen as necessary.  Sedation given:  Propofol Pacer pads placed anterior and posterior chest.  Cardioverted 1 time(s).  Cardioversion with synchronized biphasic 200J shock.  Evaluation: Findings: Post procedure EKG shows: NSR, SVT, sinus with run of PACs. Gave dose of esmolol Complications: None Patient did tolerate procedure well.  Time Spent Directly with the Patient:  20 minutes   Janina Mayo 05/09/2022, 10:38 AM

## 2022-05-09 NOTE — Transfer of Care (Signed)
Immediate Anesthesia Transfer of Care Note  Patient: Erin Gillespie  Procedure(s) Performed: CARDIOVERSION  Patient Location: Endoscopy Unit  Anesthesia Type:General  Level of Consciousness: awake, drowsy and patient cooperative  Airway & Oxygen Therapy: Patient Spontanous Breathing  Post-op Assessment: Report given to RN, Post -op Vital signs reviewed and stable and Patient moving all extremities X 4  Post vital signs: Reviewed and stable  Last Vitals:  Vitals Value Taken Time  BP    Temp    Pulse    Resp    SpO2      Last Pain:  Vitals:   05/09/22 0936  TempSrc: Temporal  PainSc: 0-No pain         Complications: No notable events documented.

## 2022-05-10 ENCOUNTER — Encounter (HOSPITAL_COMMUNITY): Payer: Self-pay | Admitting: Internal Medicine

## 2022-05-16 ENCOUNTER — Ambulatory Visit (HOSPITAL_COMMUNITY)
Admission: RE | Admit: 2022-05-16 | Discharge: 2022-05-16 | Disposition: A | Discharge status: Home / Self Care | Payer: Medicare Other | Source: Ambulatory Visit | Attending: Physician Assistant | Admitting: Physician Assistant

## 2022-05-16 VITALS — BP 142/78 | HR 66 | Ht 65.0 in | Wt 186.8 lb

## 2022-05-16 DIAGNOSIS — I4819 Other persistent atrial fibrillation: Secondary | ICD-10-CM | POA: Insufficient documentation

## 2022-05-16 DIAGNOSIS — D6869 Other thrombophilia: Secondary | ICD-10-CM | POA: Insufficient documentation

## 2022-05-16 DIAGNOSIS — I4892 Unspecified atrial flutter: Secondary | ICD-10-CM | POA: Diagnosis not present

## 2022-05-16 DIAGNOSIS — Z6831 Body mass index (BMI) 31.0-31.9, adult: Secondary | ICD-10-CM | POA: Diagnosis not present

## 2022-05-16 DIAGNOSIS — E669 Obesity, unspecified: Secondary | ICD-10-CM | POA: Insufficient documentation

## 2022-05-16 DIAGNOSIS — E785 Hyperlipidemia, unspecified: Secondary | ICD-10-CM | POA: Diagnosis not present

## 2022-05-16 DIAGNOSIS — Z7901 Long term (current) use of anticoagulants: Secondary | ICD-10-CM | POA: Diagnosis not present

## 2022-05-16 DIAGNOSIS — I471 Supraventricular tachycardia: Secondary | ICD-10-CM | POA: Insufficient documentation

## 2022-05-16 DIAGNOSIS — I10 Essential (primary) hypertension: Secondary | ICD-10-CM | POA: Insufficient documentation

## 2022-05-16 MED ORDER — METOPROLOL TARTRATE 25 MG PO TABS
ORAL_TABLET | ORAL | 1 refills | Status: DC
Start: 2022-05-16 — End: 2023-04-15

## 2022-05-16 NOTE — Progress Notes (Signed)
Primary Care Physician: Maury Dus, MD Primary Cardiologist: none Primary Electrophysiologist: Dr Curt Bears  Referring Physician: [facility]/Dr Diamantina Monks Erin Gillespie is a 81 y.o. female with a history of HTN, breast cancer, HLD, SVT, and paroxysmal atrial fibrillation who presents for follow up in the [facility].  The patient was initially diagnosed with atrial fibrillation on 08/25/19 after presenting to the ER with symptoms of an irregular heart beat. She converted to SR after 5 mg of Lopressor and was started on Eliquis. Of note, she had just finished a course of antibiotics for a surgical skin infection. She was also incidentally found to have a UTI at the ER and started on antibiotics.  Since then, she has had heart racing off and on intermittently. She states this has been going on for quite some time. She had a 30 day event monitor in 2019 which showed SVT episodes which correlated with her symptoms. On follow up, she was noted to be persistently in afib.   On follow up today, patient is s/p DCCV on 05/09/22. She does report that she has more energy in SR. She has had some heart racing post DCCV but this is becoming less frequent. No bleeding issues on anticoagulation.   Today, she denies symptoms of chest pain, shortness of breath, orthopnea, PND, lower extremity edema, dizziness, presyncope, syncope, snoring, daytime somnolence, bleeding, or neurologic sequela. The patient is tolerating medications without difficulties and is otherwise without complaint today.    Atrial Fibrillation Risk Factors:  she does not have symptoms or diagnosis of sleep apnea. she does not have a history of alcohol use.   she has a BMI of Body mass index is 31.09 kg/m.Marland Kitchen Filed Weights   05/16/22 1320  Weight: 84.7 kg    Family History  Problem Relation Age of Onset   Cancer Brother      Atrial Fibrillation Management history:  Previous antiarrhythmic drugs:  none Previous cardioversions: 05/09/22 Previous ablations: none CHADS2VASC score: 4 Anticoagulation history: Eliquis   Past Medical History:  Diagnosis Date   Anxiety    Breast cancer ([place])    Cancer ([place])    Heart murmur    echo confirmed over 5+ years ago.    Hypercholesterolemia    Hypertension    Past Surgical History:  Procedure Laterality Date   CARDIOVERSION N/A 05/09/2022   Procedure: CARDIOVERSION;  Surgeon: Janina Mayo, MD;  Location: [facility];  Service: Cardiovascular;  Laterality: N/A;   CATARACT EXTRACTION, BILATERAL Bilateral 2003   approx 2003   CHOLECYSTECTOMY     CHOLECYSTECTOMY, LAPAROSCOPIC  1990   SIMPLE MASTECTOMY WITH AXILLARY SENTINEL NODE BIOPSY Left 10/12/2018   Procedure: LEFT  MASTECTOMY WITH  SENTINEL NODE BIOPSY;  Surgeon: Autumn Messing III, MD;  Location: [facility];  Service: General;  Laterality: Left;    Current Outpatient Medications  Medication Sig Dispense Refill   acetaminophen (TYLENOL) 650 MG CR tablet Take 650-1,300 mg by mouth every 8 (eight) hours as needed for pain.     alendronate (FOSAMAX) 70 MG tablet Take 70 mg by mouth every Thursday.     ALPRAZolam (XANAX) 0.5 MG tablet Take 0.25-0.5 mg by mouth 2 (two) times daily as needed for anxiety or sleep.     apixaban (ELIQUIS) 5 MG TABS tablet TAKE 1 TABLET BY MOUTH TWICE DAILY . APPOINTMENT REQUIRED FOR FUTURE REFILLS 180 tablet 1   Ascorbic Acid (VITAMIN C) 1000 MG tablet Take 1,000 mg by  mouth daily.     calcium carbonate (OSCAL) 1500 (600 Ca) MG TABS tablet Take 600 mg of elemental calcium by mouth daily.     cholecalciferol (VITAMIN D) 1000 units tablet Take 1,000 Units by mouth daily with lunch.     diltiazem (TIAZAC) 300 MG 24 hr capsule Take 300 mg by mouth daily.     DULoxetine (CYMBALTA) 60 MG capsule Take 60 mg by mouth 2 (two) times daily. Noon and Bedtime     Ferrous Sulfate (SLOW FE) 142 (45 Fe) MG TBCR Take 142 mg by mouth daily.     letrozole (FEMARA) 2.5 MG tablet Take 1  tablet (2.5 mg total) by mouth daily. 90 tablet 3   methocarbamol (ROBAXIN) 500 MG tablet Take 1 tablet (500 mg total) by mouth every 6 (six) hours as needed for muscle spasms. 20 tablet 1   Multiple Vitamin (MULTIVITAMIN WITH MINERALS) TABS tablet Take 1 tablet by mouth daily with lunch.     Omega-3 Fatty Acids (FISH OIL) 1000 MG CAPS Take 1,000 mg by mouth daily with lunch.     pravastatin (PRAVACHOL) 40 MG tablet Take 40 mg by mouth every evening.      spironolactone (ALDACTONE) 25 MG tablet Take 50 mg by mouth every morning.     metoprolol tartrate (LOPRESSOR) 25 MG tablet TAKE 1 TABLET BY MOUTH EVERY 6 HOURS AS NEEDED FOR AFIB HOUR OVER 100 60 tablet 1   No current facility-administered medications for this encounter.    Allergies  Allergen Reactions   Clonidine Hcl     Did not work for pt    Lisinopril     Did not work for pt    Nifedipine     Did not work for pt    Terazosin     Did not work for pt    Codeine Nausea And Vomiting and Rash   Sulfa Antibiotics Rash    Social History   Socioeconomic History   Marital status: Widowed    Spouse name: Not on file   Number of children: Not on file   Years of education: Not on file   Highest education level: Not on file  Occupational History   Not on file  Tobacco Use   Smoking status: Never   Smokeless tobacco: Never   Tobacco comments:    Never smoke 04/24/22  Vaping Use   Vaping Use: Never used  Substance and Sexual Activity   Alcohol use: Yes    Alcohol/week: 2.0 standard drinks of alcohol    Types: 2 Shots of liquor per week    Comment: occasional    Drug use: No   Sexual activity: Not Currently  Other Topics Concern   Not on file  Social History Narrative   Not on file   Social Determinants of Health   Financial Resource Strain: Not on file  Food Insecurity: Not on file  Transportation Needs: Not on file  Physical Activity: Not on file  Stress: Not on file  Social Connections: Not on file  Intimate  Partner Violence: Not on file     ROS- All systems are reviewed and negative except as per the HPI above.  Physical Exam: Vitals:   05/16/22 1320  BP: (!) 142/78  Pulse: 66  Weight: 84.7 kg  Height: '5\' 5"'$  (1.651 m)     GEN- The patient is a well appearing elderly female, alert and oriented x 3 today.   HEENT-head normocephalic, atraumatic, sclera clear, conjunctiva pink, hearing  intact, trachea midline. Lungs- Clear to ausculation bilaterally, normal work of breathing Heart- Regular rate and rhythm, no murmurs, rubs or gallops  GI- soft, NT, ND, + BS Extremities- no clubbing, cyanosis, or edema MS- no significant deformity or atrophy Skin- no rash or lesion Psych- euthymic mood, full affect Neuro- strength and sensation are intact   Wt Readings from Last 3 Encounters:  05/16/22 84.7 kg  05/09/22 81.6 kg  04/24/22 83.9 kg    EKG today demonstrates  SR, 1st degree AV block Vent. rate 66 BPM PR interval * ms QRS duration 78 ms QT/QTcB 416/436 ms  Echo 07/13/20  1. Left ventricular ejection fraction, by estimation, is 60 to 65%. The  left ventricle has normal function. The left ventricle has no regional  wall motion abnormalities. Left ventricular diastolic parameters are  consistent with Grade II diastolic dysfunction (pseudonormalization). The average left ventricular global longitudinal strain is 19.7 %. The global longitudinal strain is normal.   2. Right ventricular systolic function is normal. The right ventricular  size is normal. There is normal pulmonary artery systolic pressure. The  estimated right ventricular systolic pressure is 09.9 mmHg.   3. The mitral valve is normal in structure. Trivial mitral valve  regurgitation. No evidence of mitral stenosis.   4. The aortic valve is tricuspid. Aortic valve regurgitation is mild.  Mild aortic valve sclerosis is present, with no evidence of aortic valve  stenosis.   5. The inferior vena cava is normal in size with  greater than 50%  respiratory variability, suggesting right atrial pressure of 3 mmHg.   Epic records are reviewed at length today  CHA2DS2-VASc Score = 4  The patient's score is based upon: CHF History: 0 HTN History: 1 Diabetes History: 0 Stroke History: 0 Vascular Disease History: 0 Age Score: 2 Gender Score: 1       ASSESSMENT AND PLAN: 1. Persistent Atrial Fibrillation/atrial flutter/SVT The patient's CHA2DS2-VASc score is 4, indicating a 4.8% annual risk of stroke.   S/p DCCV on 05/09/22 Patient in [place] today, she did have symptomatic improvement. Will continue to pursue rhythm control.  Continue Lopressor 25 mg PRN q6hrs for heart racing. Continue diltiazem 300 mg daily, would not increase with long PR.  Continue Eliquis 5 mg BID  2. Secondary Hypercoagulable State (ICD10:  D68.69) The patient is at significant risk for stroke/thromboembolism based upon her CHA2DS2-VASc Score of 4.  Continue Apixaban (Eliquis).   3. Obesity Body mass index is 31.09 kg/m. Lifestyle modification was discussed and encouraged including regular physical activity and weight reduction.  4. HTN Stable, no changes today.   Follow up with Dr Curt Bears per recall. AF clinic in one year.    [facility] [address] [place], [place] [address] [phone] 05/16/2022 4:43 PM

## 2022-05-22 DIAGNOSIS — N1832 Chronic kidney disease, stage 3b: Secondary | ICD-10-CM | POA: Diagnosis not present

## 2022-05-22 DIAGNOSIS — R609 Edema, unspecified: Secondary | ICD-10-CM | POA: Diagnosis not present

## 2022-05-22 DIAGNOSIS — I129 Hypertensive chronic kidney disease with stage 1 through stage 4 chronic kidney disease, or unspecified chronic kidney disease: Secondary | ICD-10-CM | POA: Diagnosis not present

## 2022-05-22 DIAGNOSIS — D6869 Other thrombophilia: Secondary | ICD-10-CM | POA: Diagnosis not present

## 2022-05-22 DIAGNOSIS — C50919 Malignant neoplasm of unspecified site of unspecified female breast: Secondary | ICD-10-CM | POA: Diagnosis not present

## 2022-05-22 DIAGNOSIS — Z0000 Encounter for general adult medical examination without abnormal findings: Secondary | ICD-10-CM | POA: Diagnosis not present

## 2022-05-22 DIAGNOSIS — M8580 Other specified disorders of bone density and structure, unspecified site: Secondary | ICD-10-CM | POA: Diagnosis not present

## 2022-05-22 DIAGNOSIS — E7800 Pure hypercholesterolemia, unspecified: Secondary | ICD-10-CM | POA: Diagnosis not present

## 2022-05-22 DIAGNOSIS — I480 Paroxysmal atrial fibrillation: Secondary | ICD-10-CM | POA: Diagnosis not present

## 2022-06-12 DIAGNOSIS — N1831 Chronic kidney disease, stage 3a: Secondary | ICD-10-CM | POA: Diagnosis not present

## 2022-08-02 DIAGNOSIS — R1030 Lower abdominal pain, unspecified: Secondary | ICD-10-CM | POA: Diagnosis not present

## 2022-08-02 DIAGNOSIS — N1832 Chronic kidney disease, stage 3b: Secondary | ICD-10-CM | POA: Diagnosis not present

## 2022-08-05 ENCOUNTER — Other Ambulatory Visit: Payer: Self-pay | Admitting: Cardiology

## 2022-08-06 ENCOUNTER — Other Ambulatory Visit (HOSPITAL_COMMUNITY): Payer: Self-pay | Admitting: *Deleted

## 2022-08-06 MED ORDER — APIXABAN 5 MG PO TABS
5.0000 mg | ORAL_TABLET | Freq: Two times a day (BID) | ORAL | 1 refills | Status: DC
Start: 2022-08-06 — End: 2023-02-13

## 2022-08-06 NOTE — Telephone Encounter (Signed)
Prescription refill request for Eliquis received. Indication: AF Last office visit: 05/16/22  C Fenton PA Scr: 1.07 on 04/24/22 Age: 81 Weight: 84.7kg  Based on above findings Eliquis '5mg'$  twice daily is the appropriate dose.  Refill approved.

## 2022-08-12 DIAGNOSIS — D6869 Other thrombophilia: Secondary | ICD-10-CM | POA: Diagnosis not present

## 2022-08-12 DIAGNOSIS — U071 COVID-19: Secondary | ICD-10-CM | POA: Diagnosis not present

## 2022-08-12 DIAGNOSIS — I480 Paroxysmal atrial fibrillation: Secondary | ICD-10-CM | POA: Diagnosis not present

## 2022-10-04 ENCOUNTER — Inpatient Hospital Stay: Payer: Medicare Other | Admitting: Hematology and Oncology

## 2022-10-08 NOTE — Progress Notes (Signed)
Patient Care Team: Maury Dus, MD as PCP - General (Family Medicine) Constance Haw, MD as PCP - Electrophysiology (Cardiology) Nicholas Lose, MD as Consulting Physician (Hematology and Oncology) Jovita Kussmaul, MD as Consulting Physician (General Surgery)  DIAGNOSIS:  Encounter Diagnosis  Name Primary?   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 status post lumpectomy 01/01/2010, ER 99%, PR 100% status post radiation therapy on 03/05/2010 to 04/30/2010, tamoxifen 20 mg daily from June 2011 - June 2016   09/07/2018 Relapse/Recurrence   Screening mammogram detected 6 mm mass at 4 o'clock position relapse in the left breast 4 cm from the nipple grade 2 IDC ER 80%, PR 10%, HER-2 negative IHC 2+ and fish ratio 1.23, Ki-67 2% T1b N0 stage Ia   10/12/2018 Surgery   Left mastectomy: IDC grade 2, 1.2 cm, high-grade DCIS, margins negative, 0/5 lymph nodes negative, ER 80%, PR 10%, HER-2 negative, Ki-67 2% T1CN0 stage Ia    11/2018 -  Anti-estrogen oral therapy   Letrozole daily     CHIEF COMPLIANT: Follow-up on letrozole therapy  INTERVAL HISTORY: Erin Gillespie is a 81 year old with above-mentioned history of breast cancer who underwent left mastectomy is currently on letrozole adjuvant therapy and she is tolerating letrozole extremely well without any problems or concerns.  She denies any lumps or nodules in the breast.  She had a mammogram the right breast which was benign.   ALLERGIES:  is allergic to clonidine hcl, lisinopril, nifedipine, terazosin, codeine, and sulfa antibiotics.  MEDICATIONS:  Current Outpatient Medications  Medication Sig Dispense Refill   acetaminophen  (TYLENOL) 650 MG CR tablet Take 650-1,300 mg by mouth every 8 (eight) hours as needed for pain.     alendronate (FOSAMAX) 70 MG tablet Take 70 mg by mouth every Thursday.     ALPRAZolam (XANAX) 0.5 MG tablet Take 0.25-0.5 mg by mouth 2 (two) times daily as needed for anxiety or sleep.     apixaban (ELIQUIS) 5 MG TABS tablet Take 1 tablet (5 mg total) by mouth 2 (two) times daily. 180 tablet 1   Ascorbic Acid (VITAMIN C) 1000 MG tablet Take 1,000 mg by mouth daily.     calcium carbonate (OSCAL) 1500 (600 Ca) MG TABS tablet Take 600 mg of elemental calcium by mouth daily.     cholecalciferol (VITAMIN D) 1000 units tablet Take 1,000 Units by mouth daily with lunch.     diltiazem (TIAZAC) 300 MG 24 hr capsule Take 300 mg by mouth daily.     DULoxetine (CYMBALTA) 60 MG capsule Take 60 mg by mouth 2 (two) times daily. Noon and Bedtime     Ferrous Sulfate (SLOW FE) 142 (45 Fe) MG TBCR Take 142 mg by mouth daily.     letrozole (FEMARA) 2.5 MG tablet Take 1 tablet (2.5 mg total) by mouth daily. 90 tablet 3   methocarbamol (ROBAXIN) 500 MG tablet Take 1 tablet (500 mg total) by mouth every 6 (six) hours as needed for muscle spasms. 20 tablet 1   metoprolol tartrate (LOPRESSOR) 25 MG tablet TAKE 1 TABLET BY MOUTH EVERY 6 HOURS AS NEEDED FOR AFIB HOUR OVER 100 60 tablet 1   Multiple Vitamin (MULTIVITAMIN WITH MINERALS) TABS tablet Take 1  tablet by mouth daily with lunch.     Omega-3 Fatty Acids (FISH OIL) 1000 MG CAPS Take 1,000 mg by mouth daily with lunch.     pravastatin (PRAVACHOL) 40 MG tablet Take 40 mg by mouth every evening.      spironolactone (ALDACTONE) 25 MG tablet Take 50 mg by mouth every morning.     No current facility-administered medications for this visit.    PHYSICAL EXAMINATION: ECOG PERFORMANCE STATUS: 1 - Symptomatic but completely ambulatory  Vitals:   10/15/22 1143  BP: (!) 149/85  Pulse: (!) 104  Resp: 18  Temp: (!) 97.3 F (36.3 C)  SpO2: 96%   Filed Weights    10/15/22 1143  Weight: 181 lb 8 oz (82.3 kg)    BREAST: No palpable masses or nodules in either right or left breasts. No palpable axillary supraclavicular or infraclavicular adenopathy no breast tenderness or nipple discharge. (exam performed in the presence of a chaperone)  LABORATORY DATA:  I have reviewed the data as listed    Latest Ref Rng & Units 04/24/2022   11:59 AM 12/25/2021   11:29 AM 08/25/2019    5:22 PM  CMP  Glucose 70 - 99 mg/dL 96  94  99   BUN 8 - 23 mg/dL _0 Creatinine 0.44 - 1.00 mg/dL 1.07  0.90  0.82   Sodium 135 - 145 mmol/L 135  138  137   Potassium 3.5 - 5.1 mmol/L 4.2  4.3  4.1   Chloride 98 - 111 mmol/L 102  101  105   CO2 22 - 32 mmol/L _1 Calcium 8.9 - 10.3 mg/dL 9.4  9.7  9.4     Lab Results  Component Value Date   WBC 9.4 04/24/2022   HGB 12.7 04/24/2022   HCT 39.7 04/24/2022   MCV 92.8 04/24/2022   PLT 368 04/24/2022   NEUTROABS 7.7 08/25/2019    ASSESSMENT & PLAN:  Malignant neoplasm of lower-outer quadrant of left breast of female, estrogen receptor positive ([place]) 10/12/2018: Left mastectomy: IDC grade 2, 1.2 cm, high-grade DCIS, margins negative, 0/5 lymph nodes negative, ER 80%, PR 10%, HER-2 negative, Ki-67 2% T1CN0 stage Ia   (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   Letrozole toxicities: Denies any adverse effects to letrozole.  Patient wishes to stay on letrozole indefinitely. H/o atrial fibrillation: Currently on blood thinners.   Breast cancer surveillance: 1.  Breast exam 10/15/2022: Benign 2.  Mammogram at [facility] 01/08/2022 right breast: Benign: Breast density category B Bone density 01/08/2022: T score -2: Osteopenia   Return  to clinic in 1 year for follow-up      No orders of the defined types were placed in this encounter.  The patient has a good understanding of the overall plan. she agrees with it. she will call with any problems that may develop before the next visit here. Total time spent: 30 mins including face to face time and time spent for planning, charting and co-ordination of care   Harriette Ohara, MD 10/15/22    I Gardiner Coins am scribing for Dr. Lindi Adie  I have reviewed the above documentation for accuracy and completeness,  and I agree with the above.

## 2022-10-15 ENCOUNTER — Other Ambulatory Visit: Payer: Self-pay

## 2022-10-15 ENCOUNTER — Inpatient Hospital Stay: Payer: Medicare Other | Attending: Hematology and Oncology | Admitting: Hematology and Oncology

## 2022-10-15 VITALS — BP 149/85 | HR 104 | Temp 97.3°F | Resp 18 | Ht 65.0 in | Wt 181.5 lb

## 2022-10-15 DIAGNOSIS — Z170 Estrogen receptor positive status [ER+]: Secondary | ICD-10-CM

## 2022-10-15 DIAGNOSIS — Z79811 Long term (current) use of aromatase inhibitors: Secondary | ICD-10-CM | POA: Diagnosis not present

## 2022-10-15 DIAGNOSIS — C50512 Malignant neoplasm of lower-outer quadrant of left female breast: Secondary | ICD-10-CM | POA: Diagnosis not present

## 2022-10-15 MED ORDER — LETROZOLE 2.5 MG PO TABS: 2.5000 mg | ORAL_TABLET | Freq: Every day | ORAL | 3 refills | Status: DC

## 2022-10-15 NOTE — Assessment & Plan Note (Addendum)
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   Letrozole toxicities: Denies any adverse effects to letrozole.  Patient wishes to stay on letrozole indefinitely. H/o atrial fibrillation: Currently on blood thinners.   Breast cancer surveillance: 1.  Breast exam 10/15/2022: Benign 2.  Mammogram at [facility] 01/08/2022 right breast: Benign: Breast density category B Bone density 01/08/2022: T score -2: Osteopenia   Return to clinic in 1 year for follow-up

## 2022-10-16 ENCOUNTER — Encounter: Payer: Self-pay | Admitting: Hematology and Oncology

## 2022-12-26 DIAGNOSIS — D6869 Other thrombophilia: Secondary | ICD-10-CM | POA: Diagnosis not present

## 2022-12-26 DIAGNOSIS — R609 Edema, unspecified: Secondary | ICD-10-CM | POA: Diagnosis not present

## 2022-12-26 DIAGNOSIS — Z862 Personal history of diseases of the blood and blood-forming organs and certain disorders involving the immune mechanism: Secondary | ICD-10-CM | POA: Diagnosis not present

## 2022-12-26 DIAGNOSIS — M859 Disorder of bone density and structure, unspecified: Secondary | ICD-10-CM | POA: Diagnosis not present

## 2022-12-26 DIAGNOSIS — E559 Vitamin D deficiency, unspecified: Secondary | ICD-10-CM | POA: Diagnosis not present

## 2022-12-26 DIAGNOSIS — E7800 Pure hypercholesterolemia, unspecified: Secondary | ICD-10-CM | POA: Diagnosis not present

## 2022-12-26 DIAGNOSIS — M25562 Pain in left knee: Secondary | ICD-10-CM | POA: Diagnosis not present

## 2022-12-26 DIAGNOSIS — N1832 Chronic kidney disease, stage 3b: Secondary | ICD-10-CM | POA: Diagnosis not present

## 2022-12-26 DIAGNOSIS — C50919 Malignant neoplasm of unspecified site of unspecified female breast: Secondary | ICD-10-CM | POA: Diagnosis not present

## 2022-12-26 DIAGNOSIS — I480 Paroxysmal atrial fibrillation: Secondary | ICD-10-CM | POA: Diagnosis not present

## 2022-12-26 DIAGNOSIS — I129 Hypertensive chronic kidney disease with stage 1 through stage 4 chronic kidney disease, or unspecified chronic kidney disease: Secondary | ICD-10-CM | POA: Diagnosis not present

## 2022-12-26 DIAGNOSIS — M8580 Other specified disorders of bone density and structure, unspecified site: Secondary | ICD-10-CM | POA: Diagnosis not present

## 2023-01-27 DIAGNOSIS — Z1231 Encounter for screening mammogram for malignant neoplasm of breast: Secondary | ICD-10-CM | POA: Diagnosis not present

## 2023-02-13 ENCOUNTER — Other Ambulatory Visit (HOSPITAL_COMMUNITY): Payer: Self-pay | Admitting: *Deleted

## 2023-02-13 MED ORDER — APIXABAN 5 MG PO TABS: 5.0000 mg | ORAL_TABLET | Freq: Two times a day (BID) | ORAL | 1 refills | Status: DC

## 2023-04-15 ENCOUNTER — Other Ambulatory Visit (HOSPITAL_COMMUNITY): Payer: Self-pay | Admitting: Physician Assistant

## 2023-07-10 DIAGNOSIS — C50919 Malignant neoplasm of unspecified site of unspecified female breast: Secondary | ICD-10-CM | POA: Diagnosis not present

## 2023-07-10 DIAGNOSIS — M85852 Other specified disorders of bone density and structure, left thigh: Secondary | ICD-10-CM | POA: Diagnosis not present

## 2023-07-10 DIAGNOSIS — Z1159 Encounter for screening for other viral diseases: Secondary | ICD-10-CM | POA: Diagnosis not present

## 2023-07-10 DIAGNOSIS — N1831 Chronic kidney disease, stage 3a: Secondary | ICD-10-CM | POA: Diagnosis not present

## 2023-07-10 DIAGNOSIS — E7800 Pure hypercholesterolemia, unspecified: Secondary | ICD-10-CM | POA: Diagnosis not present

## 2023-07-10 DIAGNOSIS — I480 Paroxysmal atrial fibrillation: Secondary | ICD-10-CM | POA: Diagnosis not present

## 2023-07-10 DIAGNOSIS — Z23 Encounter for immunization: Secondary | ICD-10-CM | POA: Diagnosis not present

## 2023-07-10 DIAGNOSIS — Z0000 Encounter for general adult medical examination without abnormal findings: Secondary | ICD-10-CM | POA: Diagnosis not present

## 2023-07-10 DIAGNOSIS — D6869 Other thrombophilia: Secondary | ICD-10-CM | POA: Diagnosis not present

## 2023-08-27 ENCOUNTER — Other Ambulatory Visit (HOSPITAL_COMMUNITY): Payer: Self-pay | Admitting: Physician Assistant

## 2023-09-16 ENCOUNTER — Other Ambulatory Visit (HOSPITAL_COMMUNITY): Payer: Self-pay | Admitting: Physician Assistant

## 2023-09-24 ENCOUNTER — Other Ambulatory Visit (HOSPITAL_COMMUNITY): Payer: Self-pay | Admitting: *Deleted

## 2023-09-24 MED ORDER — METOPROLOL TARTRATE 25 MG PO TABS
ORAL_TABLET | ORAL | 0 refills | Status: DC
Start: 2023-09-24 — End: 2024-03-24

## 2023-10-15 ENCOUNTER — Ambulatory Visit (HOSPITAL_COMMUNITY): Payer: 59 | Admitting: Physician Assistant

## 2023-10-16 ENCOUNTER — Inpatient Hospital Stay: Payer: 59 | Attending: Hematology and Oncology | Admitting: Hematology and Oncology

## 2023-10-16 DIAGNOSIS — C50512 Malignant neoplasm of lower-outer quadrant of left female breast: Secondary | ICD-10-CM

## 2023-10-16 DIAGNOSIS — Z170 Estrogen receptor positive status [ER+]: Secondary | ICD-10-CM

## 2023-10-16 MED ORDER — LETROZOLE 2.5 MG PO TABS: 2.5000 mg | ORAL_TABLET | Freq: Every day | ORAL | 3 refills | Status: AC

## 2023-10-16 NOTE — Progress Notes (Signed)
HEMATOLOGY-ONCOLOGY TELEPHONE VISIT PROGRESS NOTE  I connected with our patient on 10/16/23 at 10:15 AM EST by telephone and verified that I am speaking with the correct person using two identifiers.  I discussed the limitations, risks, security and privacy concerns of performing an evaluation and management service by telephone and the availability of in person appointments.  I also discussed with the patient that there may be a patient responsible charge related to this service. The patient expressed understanding and agreed to proceed.   History of Present Illness: Follow-up on letrozole therapy   History of Present Illness   The patient, with a history of breast cancer, presents for a routine follow-up visit by telephone. She reports feeling 'about the same as I did last year.' She denies any breast pain or discomfort and had a mammogram  this year and it was normal. This was done on 01/27/23. 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 post lumpectomy 01/01/2010, ER 99%, PR 100% status post radiation therapy on 03/05/2010 to 04/30/2010, tamoxifen 20 mg daily from June 2011 - June 2016   09/07/2018 Relapse/Recurrence   Screening mammogram detected 6 mm mass at 4 o'clock position relapse in the left breast 4 cm from the nipple grade 2 IDC ER 80%, PR 10%, HER-2 negative IHC 2+ and fish ratio 1.23, Ki-67 2% T1b N0 stage Ia   10/12/2018 Surgery   Left mastectomy: IDC grade 2, 1.2 cm, high-grade DCIS, margins negative, 0/5 lymph nodes negative, ER 80%, PR 10%, HER-2 negative, Ki-67 2% T1CN0 stage Ia    11/2018 -  Anti-estrogen oral therapy   Letrozole daily     REVIEW OF SYSTEMS:   Constitutional: Denies fevers,  chills or abnormal weight loss All other systems were reviewed with the patient and are negative. 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   Letrozole toxicities: Denies any adverse effects to letrozole.  Patient wishes to stay on letrozole indefinitely. H/o atrial fibrillation: Currently on blood thinners.   Breast cancer surveillance: 1.  Mammogram at [facility] 01/27/2023 right breast: Benign: Breast density category B Bone density 01/08/2022: T score -2: Osteopenia   Return to clinic in 1 year for follow-up     I discussed the assessment and treatment plan with the patient. The patient was provided an opportunity to ask questions and all were answered. The patient agreed with the plan and demonstrated an understanding of the instructions. The patient was advised to call back or seek an in-person evaluation if the symptoms worsen or if the condition fails to improve as anticipated.   I provided 12 minutes of non-face-to-face time during this encounter.  This  includes time for charting and coordination of care   Tamsen Meek, MD

## 2023-10-16 NOTE — Assessment & Plan Note (Addendum)
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   Letrozole toxicities: Denies any adverse effects to letrozole.  Patient wishes to stay on letrozole indefinitely. H/o atrial fibrillation: Currently on blood thinners.   Breast cancer surveillance: 1.  Mammogram at [facility] 01/08/2022 right breast: Benign: Breast density category B Bone density 01/08/2022: T score -2: Osteopenia   Return to clinic in 1 year for follow-up

## 2023-10-21 ENCOUNTER — Encounter: Payer: Self-pay | Admitting: Hematology and Oncology

## 2023-10-29 ENCOUNTER — Ambulatory Visit (HOSPITAL_COMMUNITY): Payer: 59 | Admitting: Physician Assistant

## 2023-10-29 ENCOUNTER — Encounter (HOSPITAL_COMMUNITY): Payer: Self-pay

## 2024-02-23 DIAGNOSIS — C50919 Malignant neoplasm of unspecified site of unspecified female breast: Secondary | ICD-10-CM | POA: Diagnosis not present

## 2024-02-23 DIAGNOSIS — I480 Paroxysmal atrial fibrillation: Secondary | ICD-10-CM | POA: Diagnosis not present

## 2024-02-23 DIAGNOSIS — M8580 Other specified disorders of bone density and structure, unspecified site: Secondary | ICD-10-CM | POA: Diagnosis not present

## 2024-02-23 DIAGNOSIS — E7800 Pure hypercholesterolemia, unspecified: Secondary | ICD-10-CM | POA: Diagnosis not present

## 2024-02-23 DIAGNOSIS — R609 Edema, unspecified: Secondary | ICD-10-CM | POA: Diagnosis not present

## 2024-02-23 DIAGNOSIS — D649 Anemia, unspecified: Secondary | ICD-10-CM | POA: Diagnosis not present

## 2024-02-23 DIAGNOSIS — N1832 Chronic kidney disease, stage 3b: Secondary | ICD-10-CM | POA: Diagnosis not present

## 2024-02-23 DIAGNOSIS — I129 Hypertensive chronic kidney disease with stage 1 through stage 4 chronic kidney disease, or unspecified chronic kidney disease: Secondary | ICD-10-CM | POA: Diagnosis not present

## 2024-03-06 ENCOUNTER — Other Ambulatory Visit (HOSPITAL_COMMUNITY): Payer: Self-pay | Admitting: Physician Assistant

## 2024-03-08 ENCOUNTER — Other Ambulatory Visit (HOSPITAL_COMMUNITY): Payer: Self-pay | Admitting: *Deleted

## 2024-03-08 MED ORDER — APIXABAN 5 MG PO TABS: 5.0000 mg | ORAL_TABLET | Freq: Two times a day (BID) | ORAL | 0 refills | Status: DC

## 2024-03-18 DIAGNOSIS — Z1231 Encounter for screening mammogram for malignant neoplasm of breast: Secondary | ICD-10-CM | POA: Diagnosis not present

## 2024-03-18 DIAGNOSIS — M810 Age-related osteoporosis without current pathological fracture: Secondary | ICD-10-CM | POA: Diagnosis not present

## 2024-03-18 DIAGNOSIS — M8588 Other specified disorders of bone density and structure, other site: Secondary | ICD-10-CM | POA: Diagnosis not present

## 2024-03-24 ENCOUNTER — Ambulatory Visit (HOSPITAL_COMMUNITY)
Admission: RE | Admit: 2024-03-24 | Discharge: 2024-03-24 | Disposition: A | Discharge status: Home / Self Care | Source: Ambulatory Visit | Attending: Physician Assistant | Admitting: Physician Assistant

## 2024-03-24 VITALS — BP 122/80 | HR 78 | Ht 65.0 in | Wt 178.6 lb

## 2024-03-24 DIAGNOSIS — E785 Hyperlipidemia, unspecified: Secondary | ICD-10-CM | POA: Diagnosis not present

## 2024-03-24 DIAGNOSIS — I4892 Unspecified atrial flutter: Secondary | ICD-10-CM | POA: Insufficient documentation

## 2024-03-24 DIAGNOSIS — I4819 Other persistent atrial fibrillation: Secondary | ICD-10-CM | POA: Insufficient documentation

## 2024-03-24 DIAGNOSIS — Z79899 Other long term (current) drug therapy: Secondary | ICD-10-CM | POA: Diagnosis not present

## 2024-03-24 DIAGNOSIS — D6869 Other thrombophilia: Secondary | ICD-10-CM | POA: Insufficient documentation

## 2024-03-24 DIAGNOSIS — Z7901 Long term (current) use of anticoagulants: Secondary | ICD-10-CM | POA: Insufficient documentation

## 2024-03-24 DIAGNOSIS — I10 Essential (primary) hypertension: Secondary | ICD-10-CM | POA: Diagnosis not present

## 2024-03-24 DIAGNOSIS — I471 Supraventricular tachycardia, unspecified: Secondary | ICD-10-CM | POA: Insufficient documentation

## 2024-03-24 MED ORDER — DILTIAZEM HCL ER BEADS 300 MG PO CP24: 300.0000 mg | ORAL_CAPSULE | Freq: Every day | ORAL | 3 refills | Status: AC

## 2024-03-24 MED ORDER — APIXABAN 5 MG PO TABS: 5.0000 mg | ORAL_TABLET | Freq: Two times a day (BID) | ORAL | 11 refills | Status: AC

## 2024-03-24 MED ORDER — METOPROLOL TARTRATE 25 MG PO TABS: ORAL_TABLET | ORAL | 2 refills | Status: AC

## 2024-03-24 NOTE — Progress Notes (Signed)
 Primary Care Physician: Tally Joe, MD Primary Cardiologist: none Primary Electrophysiologist: Dr Elberta Fortis  Referring Physician: [facility]/Dr Benjaman Pott Erin Gillespie is a 83 y.o. female with a history of HTN, breast cancer, HLD, SVT, and paroxysmal atrial fibrillation who presents for follow up in the [facility].  The patient was initially diagnosed with atrial fibrillation on 08/25/19 after presenting to the ER with symptoms of an irregular heart beat. She converted to SR after 5 mg of Lopressor and was started on Eliquis. Of note, she had just finished a course of antibiotics for a surgical skin infection. She was also incidentally found to have a UTI at the ER and started on antibiotics.  Since then, she has had heart racing off and on intermittently. She states this has been going on for quite some time. She had a 30 day event monitor in 2019 which showed SVT episodes which correlated with her symptoms. On follow up, she was noted to be persistently in afib. Patient is s/p DCCV on 05/09/22.   Patient returns for follow up for atrial fibrillation. She reports that she feels "wonderful". She is in rate controlled afib today. No bleeding issues on anticoagulation. She is unaware of her arrhythmia and is unsure when she went out of rhythm.   Today, she  denies symptoms of palpitations, chest pain, shortness of breath, orthopnea, PND, lower extremity edema, dizziness, presyncope, syncope, snoring, daytime somnolence, bleeding, or neurologic sequela. The patient is tolerating medications without difficulties and is otherwise without complaint today.    Atrial Fibrillation Risk Factors:  she does not have symptoms or diagnosis of sleep apnea. she does not have a history of alcohol use.   Atrial Fibrillation Management history:  Previous antiarrhythmic drugs: none Previous cardioversions: 05/09/22 Previous ablations: none Anticoagulation history: Eliquis   Past  Medical History:  Diagnosis Date   Anxiety    Breast cancer (HCC)    Cancer (HCC)    Heart murmur    echo confirmed over 5+ years ago.    Hypercholesterolemia    Hypertension     Current Outpatient Medications  Medication Sig Dispense Refill   acetaminophen (TYLENOL) 650 MG CR tablet Take 650-1,300 mg by mouth as needed for pain.     alendronate (FOSAMAX) 70 MG tablet Take 70 mg by mouth every Thursday.     ALPRAZolam (XANAX) 0.5 MG tablet Take 0.25-0.5 mg by mouth 2 (two) times daily as needed for anxiety or sleep.     apixaban (ELIQUIS) 5 MG TABS tablet Take 1 tablet (5 mg total) by mouth 2 (two) times daily. 60 tablet 0   Ascorbic Acid (VITAMIN C) 1000 MG tablet Take 1,000 mg by mouth daily.     calcium carbonate (OSCAL) 1500 (600 Ca) MG TABS tablet Take 600 mg of elemental calcium by mouth daily.     cholecalciferol (VITAMIN D) 1000 units tablet Take 1,000 Units by mouth daily with lunch.     diltiazem (TIAZAC) 300 MG 24 hr capsule Take 300 mg by mouth daily.     DULoxetine (CYMBALTA) 60 MG capsule Take 60 mg by mouth 2 (two) times daily. Noon and Bedtime     Ferrous Sulfate (SLOW FE) 142 (45 Fe) MG TBCR Take 142 mg by mouth daily.     letrozole (FEMARA) 2.5 MG tablet Take 1 tablet (2.5 mg total) by mouth daily. 90 tablet 3   metoprolol tartrate (LOPRESSOR) 25 MG tablet TAKE 1 TABLET BY MOUTH  EVERY 6 HOURS AS NEEDED FOR AFIB HR OVER 100. 30 tablet 0   Multiple Vitamin (MULTIVITAMIN WITH MINERALS) TABS tablet Take 1 tablet by mouth daily with lunch.     pravastatin (PRAVACHOL) 40 MG tablet Take 40 mg by mouth every evening.      spironolactone (ALDACTONE) 25 MG tablet Take 25 mg by mouth every morning.     No current facility-administered medications for this encounter.    ROS- All systems are reviewed and negative except as per the HPI above.  Physical Exam: Vitals:   03/24/24 1334  BP: 122/80  Pulse: 78  Weight: 81 kg  Height: 5\' 5"  (1.651 m)     GEN: Well nourished,  well developed in no acute distress CARDIAC: Irregularly irregular rate and rhythm, no murmurs, rubs, gallops RESPIRATORY:  Clear to auscultation without rales, wheezing or rhonchi  ABDOMEN: Soft, non-tender, non-distended EXTREMITIES:  No edema; No deformity    Wt Readings from Last 3 Encounters:  03/24/24 81 kg  10/15/22 82.3 kg  05/16/22 84.7 kg    EKG today demonstrates  Afib Vent. rate 78 BPM PR interval * ms QRS duration 76 ms QT/QTcB 386/440 ms   Echo 07/13/20  1. Left ventricular ejection fraction, by estimation, is 60 to 65%. The  left ventricle has normal function. The left ventricle has no regional  wall motion abnormalities. Left ventricular diastolic parameters are  consistent with Grade II diastolic dysfunction (pseudonormalization). The average left ventricular global longitudinal strain is 19.7 %. The global longitudinal strain is normal.   2. Right ventricular systolic function is normal. The right ventricular  size is normal. There is normal pulmonary artery systolic pressure. The  estimated right ventricular systolic pressure is 21.7 mmHg.   3. The mitral valve is normal in structure. Trivial mitral valve  regurgitation. No evidence of mitral stenosis.   4. The aortic valve is tricuspid. Aortic valve regurgitation is mild.  Mild aortic valve sclerosis is present, with no evidence of aortic valve  stenosis.   5. The inferior vena cava is normal in size with greater than 50%  respiratory variability, suggesting right atrial pressure of 3 mmHg.   Epic records are reviewed at length today  CHA2DS2-VASc Score = 4  The patient's score is based upon: CHF History: 0 HTN History: 1 Diabetes History: 0 Stroke History: 0 Vascular Disease History: 0 Age Score: 2 Gender Score: 1       ASSESSMENT AND PLAN: Persistent Atrial Fibrillation/atrial flutter/SVT The patient's CHA2DS2-VASc score is 4, indicating a 4.8% annual risk of stroke.   Patient in rate  controlled afib today, asymptomatic. Unclear duration, possibly longstanding.  We discussed rate vs rhythm control. Given her age and paucity of symptoms, she would like to pursue a conservative rate control strategy.  Continue diltiazem 300 mg daily Continue Eliquis 5 mg BID Continue Lopressor 25 mg PRN q 6 hours for heart racing.   Secondary Hypercoagulable State (ICD10:  D68.69) The patient is at significant risk for stroke/thromboembolism based upon her CHA2DS2-VASc Score of 4.  Continue Apixaban (Eliquis). No bleeding issues.   HTN Stable on current regimen   Follow up with Dr Elberta Fortis in one year. AF clinic as needed.    Jorja Loa PA-C [facility] [address] [place], [place] [address] [phone] 03/24/2024 1:55 PM

## 2024-03-31 ENCOUNTER — Ambulatory Visit (HOSPITAL_COMMUNITY): Admitting: Physician Assistant

## 2024-06-21 ENCOUNTER — Emergency Department (HOSPITAL_COMMUNITY)

## 2024-06-21 ENCOUNTER — Emergency Department (HOSPITAL_COMMUNITY)
Admission: EM | Admit: 2024-06-21 | Discharge: 2024-06-22 | Discharge status: Against Medical Advice | Attending: Emergency Medicine | Admitting: Emergency Medicine

## 2024-06-21 DIAGNOSIS — W010XXA Fall on same level from slipping, tripping and stumbling without subsequent striking against object, initial encounter: Secondary | ICD-10-CM | POA: Insufficient documentation

## 2024-06-21 DIAGNOSIS — Z5321 Procedure and treatment not carried out due to patient leaving prior to being seen by health care provider: Secondary | ICD-10-CM | POA: Insufficient documentation

## 2024-06-21 DIAGNOSIS — M545 Low back pain, unspecified: Secondary | ICD-10-CM | POA: Diagnosis not present

## 2024-06-21 DIAGNOSIS — M4856XA Collapsed vertebra, not elsewhere classified, lumbar region, initial encounter for fracture: Secondary | ICD-10-CM | POA: Diagnosis not present

## 2024-06-21 DIAGNOSIS — R2689 Other abnormalities of gait and mobility: Secondary | ICD-10-CM | POA: Insufficient documentation

## 2024-06-21 DIAGNOSIS — I700 Atherosclerosis of aorta: Secondary | ICD-10-CM | POA: Diagnosis not present

## 2024-06-21 DIAGNOSIS — M549 Dorsalgia, unspecified: Secondary | ICD-10-CM | POA: Diagnosis not present

## 2024-06-21 DIAGNOSIS — I10 Essential (primary) hypertension: Secondary | ICD-10-CM | POA: Diagnosis not present

## 2024-06-21 DIAGNOSIS — S3992XA Unspecified injury of lower back, initial encounter: Secondary | ICD-10-CM | POA: Diagnosis not present

## 2024-06-21 DIAGNOSIS — M51369 Other intervertebral disc degeneration, lumbar region without mention of lumbar back pain or lower extremity pain: Secondary | ICD-10-CM | POA: Diagnosis not present

## 2024-06-21 NOTE — ED Notes (Signed)
 Pt states she's been here long enough and is going home

## 2024-06-21 NOTE — ED Provider Triage Note (Signed)
 Emergency Medicine Provider Triage Evaluation Note  Erin Gillespie , a 83 y.o. female  was evaluated in triage.  Pt complains of pain in the lower back after a fall on Saturday.  She was chasing the dog and slipped and fell landing directly on her back.  She continues to have significant pain and difficulty with mobility.  Review of Systems  Positive: Back pain Negative: No head injury, LOC, neck pain.  No weakness in the legs  Physical Exam  BP 131/72 (BP Location: Right Arm)   Pulse 83   Temp 98.1 F (36.7 C)   Resp 17   SpO2 96%  Gen:   Awake, no distress   Resp:  Normal effort  MSK:   Moves extremities without difficulty pain with palpation in the lower back Other:  No abdominal pain  Medical Decision Making  Medically screening exam initiated at 3:25 PM.  Appropriate orders placed.  Erin Gillespie was informed that the remainder of the evaluation will be completed by another provider, this initial triage assessment does not replace that evaluation, and the importance of remaining in the ED until their evaluation is complete.     Erin Folks, MD 06/21/24 1526

## 2024-06-21 NOTE — ED Triage Notes (Signed)
 PT arrived via [facility] from home for lower back pain from fall on Saturday, ambulatory on scene

## 2024-06-29 DIAGNOSIS — M791 Myalgia, unspecified site: Secondary | ICD-10-CM | POA: Diagnosis not present

## 2024-06-29 DIAGNOSIS — M8008XA Age-related osteoporosis with current pathological fracture, vertebra(e), initial encounter for fracture: Secondary | ICD-10-CM | POA: Diagnosis not present

## 2024-06-29 DIAGNOSIS — M47816 Spondylosis without myelopathy or radiculopathy, lumbar region: Secondary | ICD-10-CM | POA: Diagnosis not present

## 2024-07-08 DIAGNOSIS — N1831 Chronic kidney disease, stage 3a: Secondary | ICD-10-CM | POA: Diagnosis not present

## 2024-07-08 DIAGNOSIS — I480 Paroxysmal atrial fibrillation: Secondary | ICD-10-CM | POA: Diagnosis not present

## 2024-07-08 DIAGNOSIS — N1832 Chronic kidney disease, stage 3b: Secondary | ICD-10-CM | POA: Diagnosis not present

## 2024-07-08 DIAGNOSIS — I129 Hypertensive chronic kidney disease with stage 1 through stage 4 chronic kidney disease, or unspecified chronic kidney disease: Secondary | ICD-10-CM | POA: Diagnosis not present

## 2024-07-08 DIAGNOSIS — E7800 Pure hypercholesterolemia, unspecified: Secondary | ICD-10-CM | POA: Diagnosis not present

## 2024-07-27 DIAGNOSIS — M791 Myalgia, unspecified site: Secondary | ICD-10-CM | POA: Diagnosis not present

## 2024-07-27 DIAGNOSIS — M47816 Spondylosis without myelopathy or radiculopathy, lumbar region: Secondary | ICD-10-CM | POA: Diagnosis not present

## 2024-07-27 DIAGNOSIS — M8008XA Age-related osteoporosis with current pathological fracture, vertebra(e), initial encounter for fracture: Secondary | ICD-10-CM | POA: Diagnosis not present

## 2024-08-08 DIAGNOSIS — N1832 Chronic kidney disease, stage 3b: Secondary | ICD-10-CM | POA: Diagnosis not present

## 2024-08-08 DIAGNOSIS — I480 Paroxysmal atrial fibrillation: Secondary | ICD-10-CM | POA: Diagnosis not present

## 2024-08-08 DIAGNOSIS — E7800 Pure hypercholesterolemia, unspecified: Secondary | ICD-10-CM | POA: Diagnosis not present

## 2024-08-31 DIAGNOSIS — M791 Myalgia, unspecified site: Secondary | ICD-10-CM | POA: Diagnosis not present

## 2024-08-31 DIAGNOSIS — M47816 Spondylosis without myelopathy or radiculopathy, lumbar region: Secondary | ICD-10-CM | POA: Diagnosis not present

## 2024-08-31 DIAGNOSIS — M8008XA Age-related osteoporosis with current pathological fracture, vertebra(e), initial encounter for fracture: Secondary | ICD-10-CM | POA: Diagnosis not present

## 2024-09-07 DIAGNOSIS — N1832 Chronic kidney disease, stage 3b: Secondary | ICD-10-CM | POA: Diagnosis not present

## 2024-09-07 DIAGNOSIS — I480 Paroxysmal atrial fibrillation: Secondary | ICD-10-CM | POA: Diagnosis not present

## 2024-09-07 DIAGNOSIS — E7800 Pure hypercholesterolemia, unspecified: Secondary | ICD-10-CM | POA: Diagnosis not present

## 2024-09-13 DIAGNOSIS — Z8781 Personal history of (healed) traumatic fracture: Secondary | ICD-10-CM | POA: Diagnosis not present

## 2024-09-13 DIAGNOSIS — M1712 Unilateral primary osteoarthritis, left knee: Secondary | ICD-10-CM | POA: Diagnosis not present

## 2024-09-13 DIAGNOSIS — M85852 Other specified disorders of bone density and structure, left thigh: Secondary | ICD-10-CM | POA: Diagnosis not present

## 2024-09-13 DIAGNOSIS — Z23 Encounter for immunization: Secondary | ICD-10-CM | POA: Diagnosis not present

## 2024-09-13 DIAGNOSIS — Z0000 Encounter for general adult medical examination without abnormal findings: Secondary | ICD-10-CM | POA: Diagnosis not present

## 2024-09-13 DIAGNOSIS — I129 Hypertensive chronic kidney disease with stage 1 through stage 4 chronic kidney disease, or unspecified chronic kidney disease: Secondary | ICD-10-CM | POA: Diagnosis not present

## 2024-09-13 DIAGNOSIS — N1831 Chronic kidney disease, stage 3a: Secondary | ICD-10-CM | POA: Diagnosis not present

## 2024-09-13 DIAGNOSIS — E7800 Pure hypercholesterolemia, unspecified: Secondary | ICD-10-CM | POA: Diagnosis not present

## 2024-09-13 DIAGNOSIS — I480 Paroxysmal atrial fibrillation: Secondary | ICD-10-CM | POA: Diagnosis not present

## 2024-09-13 DIAGNOSIS — R7309 Other abnormal glucose: Secondary | ICD-10-CM | POA: Diagnosis not present

## 2024-10-08 DIAGNOSIS — N1832 Chronic kidney disease, stage 3b: Secondary | ICD-10-CM | POA: Diagnosis not present

## 2024-10-08 DIAGNOSIS — E7800 Pure hypercholesterolemia, unspecified: Secondary | ICD-10-CM | POA: Diagnosis not present

## 2024-10-08 DIAGNOSIS — I480 Paroxysmal atrial fibrillation: Secondary | ICD-10-CM | POA: Diagnosis not present

## 2024-10-18 NOTE — Assessment & Plan Note (Deleted)
 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   Letrozole  toxicities: Denies any adverse effects to letrozole .  Patient wishes to stay on letrozole  indefinitely. H/o atrial fibrillation: Currently on blood thinners.   Breast cancer surveillance: 1.  Mammogram at [facility] 01/27/2023 right breast: Benign: Breast density category B 2.  Breast exam 10/19/2024: Benign  Bone density 01/08/2022: T score -2: Osteopenia   Return to clinic in 1 year for follow-up

## 2024-10-19 ENCOUNTER — Inpatient Hospital Stay: Payer: 59 | Attending: Hematology and Oncology | Admitting: Hematology and Oncology

## 2024-10-19 DIAGNOSIS — C50512 Malignant neoplasm of lower-outer quadrant of left female breast: Secondary | ICD-10-CM
